# Patient Record
Sex: Male | Born: 2000 | Race: White | Hispanic: No | Marital: Single | Smoking: Never smoker
Health system: Southern US, Community
[De-identification: ages and names within clinical notes are randomized; demographics above are authoritative.]

## PROBLEM LIST (undated history)

## (undated) DIAGNOSIS — T7840XA Allergy, unspecified, initial encounter: Secondary | ICD-10-CM

## (undated) DIAGNOSIS — E119 Type 2 diabetes mellitus without complications: Secondary | ICD-10-CM

## (undated) DIAGNOSIS — K589 Irritable bowel syndrome without diarrhea: Secondary | ICD-10-CM

## (undated) HISTORY — DX: Irritable bowel syndrome without diarrhea: K58.9

## (undated) HISTORY — DX: Allergy, unspecified, initial encounter: T78.40XA

## (undated) HISTORY — DX: Type 2 diabetes mellitus without complications: E11.9

---
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
## 2019-01-09 ENCOUNTER — Other Ambulatory Visit (INDEPENDENT_AMBULATORY_CARE_PROVIDER_SITE_OTHER): Payer: Self-pay | Admitting: Family

## 2019-01-23 ENCOUNTER — Encounter (INDEPENDENT_AMBULATORY_CARE_PROVIDER_SITE_OTHER): Payer: Self-pay | Admitting: Family

## 2019-01-23 ENCOUNTER — Other Ambulatory Visit: Payer: Self-pay

## 2019-01-23 ENCOUNTER — Ambulatory Visit (INDEPENDENT_AMBULATORY_CARE_PROVIDER_SITE_OTHER): Payer: BC Managed Care – PPO | Admitting: Family

## 2019-01-23 VITALS — BP 106/58 | HR 72 | Ht 69.69 in | Wt 152.2 lb

## 2019-01-23 DIAGNOSIS — R739 Hyperglycemia, unspecified: Secondary | ICD-10-CM

## 2019-01-23 DIAGNOSIS — Z4681 Encounter for fitting and adjustment of insulin pump: Secondary | ICD-10-CM

## 2019-01-23 DIAGNOSIS — F54 Psychological and behavioral factors associated with disorders or diseases classified elsewhere: Secondary | ICD-10-CM

## 2019-01-23 DIAGNOSIS — R7309 Other abnormal glucose: Secondary | ICD-10-CM

## 2019-01-23 DIAGNOSIS — IMO0001 Reserved for inherently not codable concepts without codable children: Secondary | ICD-10-CM

## 2019-01-23 DIAGNOSIS — E1065 Type 1 diabetes mellitus with hyperglycemia: Secondary | ICD-10-CM

## 2019-01-23 DIAGNOSIS — E10649 Type 1 diabetes mellitus with hypoglycemia without coma: Secondary | ICD-10-CM | POA: Diagnosis not present

## 2019-01-23 LAB — POCT GLUCOSE (DEVICE FOR HOME USE): POC Glucose: 251 mg/dl — AB (ref 70–99)

## 2019-01-23 LAB — POCT GLYCOSYLATED HEMOGLOBIN (HGB A1C): Hemoglobin A1C: 8.7 % — AB (ref 4.0–5.6)

## 2019-01-23 NOTE — Progress Notes (Signed)
Pediatric Endocrinology Diabetes follow up Consultation Visit  James Yu November 15, 2000 [id number]  Chief Complaint:  Type 1 Diabetes    Yu, James RadonJoshua A, MD   HPI: James Yu  is a 18 y.o. male presenting for follow-up of Type 1 Diabetes   he is accompanied to this visit by his mother.  1. James Yu was diagnosed with type 1 diabetes at 18 years of age. [facility]. He was initially on MDI and the transitioned to [employer] insulin pump. During his time with diabetes he has switch back to MDI but struggled with consistency. He is currently using an OMnipod insulin pump and Freestyle libre. He does not feel like his diabetes care has been very good over the past 2-3 year.   2. Since his last visit to clinic on 09/2018, he has been well. No ER of hospital visits.   He took a month off from work when COVID was spiking but has started back at [employer]. Wearing Dexcom CGM and Tandem Tslim using Control IQ. He feels like he has had better blood sugars since starting control IQ. However, he does acknowledge that he will go days without bolusing some weeks. He also has decreased his carb intake so he is no bolusing to correct blood sugar when he is hyperglycemic. At night he does not bolus for snacks and then will run high all night.   He states that his mom made changes to his pump by increasing his basal rates because he was running high. He did not tell her that he was running higher because he was not consistently bolusing. He now has to decrease his bolus amount to prevent going low.    Insulin regimen: Omnipod Insulin Pump  Basal Rates 12AM 1.10  7am 1.75             Insulin to Carbohydrate Ratio 12AM 15  7am 6  4pm 7          Insulin Sensitivity Factor 12AM 50               Target Blood Glucose 12AM 140  7am 120            Hypoglycemia: cannot feel most low blood sugars.  No glucagon needed recently.  Insulin  pump and CGm download   - Avg Bg 204  - Target range; In target 22%, above target 74% and below target 4%   - pattern of hyperglycemia between 2pm-8am  - Using 56 units per day   - 70% basal!!! And 30% bolus.  Med-alert ID: is not currently wearing. Injection/Pump sites: arms, abdomen.  Annual labs due: 2020 Ophthalmology due: 2020.  Reminded to get annual dilated eye exam    3. ROS: Greater than 10 systems reviewed with pertinent positives listed in HPI, otherwise neg. Constitutional: Good energy and appetite. Sleeping well.  Eyes: No changes in vision Ears/Nose/Mouth/Throat: No difficulty swallowing. Cardiovascular: No palpitations Respiratory: No increased work of breathing Gastrointestinal: No constipation or diarrhea. No abdominal pain Genitourinary: No nocturia, no polyuria Musculoskeletal: No joint pain Neurologic: Normal sensation, no tremor Endocrine: No polydipsia.  No hyperpigmentation Psychiatric: Normal affect  Past Medical History:   Past Medical History:  Diagnosis Date  . Allergy   . Diabetes mellitus without complication (HCC) 08/2003  . Irritable bowel syndrome (IBS)     Medications:  Outpatient Encounter Medications as of 01/23/2019  Medication Sig Note  . Continuous Blood Gluc Sensor (DEXCOM G6 SENSOR)  MISC 1 Units by Does not apply route as directed. Change sensor every 10 days   . Continuous Blood Gluc Transmit (DEXCOM G6 TRANSMITTER) MISC 1 Units by Does not apply route every 3 (three) months. Change transmitter every 3 months   . fluticasone (FLONASE) 50 MCG/ACT nasal spray Place 1 spray into both nostrils daily as needed for allergies or rhinitis.   . Glucagon (BAQSIMI ONE PACK) 3 MG/DOSE POWD Place 1 Dose into the nose as needed.   Marland Kitchen. glucose blood (ONETOUCH VERIO) test strip Check blood sugar 6x daily   . insulin aspart (NOVOLOG) 100 UNIT/ML injection INJECT 300 UNITS INTO THE PUMP EVERY 48 HOURS   . Insulin Infusion Pump (T:SLIM X2 INS  PUMP/CONTROL-IQ) DEVI    . loratadine (CLARITIN) 10 MG tablet Take 10 mg by mouth daily as needed.    . loratadine-pseudoephedrine (CLARITIN-D 24 HOUR) 10-240 MG 24 hr tablet Claritin-D 24 Hour 10 mg-240 mg tablet,extended release   . metFORMIN (GLUCOPHAGE-XR) 750 MG 24 hr tablet Take 750 mg by mouth daily as needed.  08/14/2018: Patient still has if needed but has not had to take in several months  . acetaminophen (TYLENOL) 325 MG tablet Take 2 tablets (650 mg total) by mouth every 6 (six) hours as needed for mild pain. (Patient not taking: Reported on 10/23/2018)   . B Complex Vitamins (VITAMIN-B COMPLEX) TABS Take 1 capsule by mouth daily.   Marland Kitchen. BESIVANCE 0.6 % SUSP Place 1 drop into the left eye 3 (three) times daily.   . Continuous Blood Gluc Sensor (FREESTYLE LIBRE 14 DAY SENSOR) MISC Inject into the skin.   Marland Kitchen. dicyclomine (BENTYL) 10 MG capsule Take 1 capsule (10 mg total) by mouth 4 (four) times daily -  before meals and at bedtime. (Patient taking differently: Take 10 mg by mouth 4 (four) times daily as needed. ) 01/23/2019: PRN  . diphenhydrAMINE (BENADRYL) 25 mg capsule Take 1 capsule (25 mg total) by mouth every 8 (eight) hours as needed for itching. (Patient not taking: Reported on 10/23/2018)   . DM-Phenylephrine-Acetaminophen (VICKS DAYQUIL COLD & FLU) 10-5-325 MG CAPS Take 2 capsules by mouth daily as needed.   . docusate sodium (COLACE) 100 MG capsule Take 1 capsule (100 mg total) by mouth daily. (Patient not taking: Reported on 10/23/2018)   . esomeprazole (NEXIUM) 20 MG capsule Take 20 mg by mouth daily at 12 noon.   Marland Kitchen. HYDROcodone-acetaminophen (NORCO) 7.5-325 MG tablet Take 1 tablet by mouth every 6 (six) hours as needed for severe pain. (Patient not taking: Reported on 10/23/2018)   . magnesium oxide (MAG-OX) 400 MG tablet Take 400 mg by mouth daily as needed.  01/23/2019: PRN  . polyethylene glycol (MIRALAX / GLYCOLAX) packet Take 17 g by mouth daily. Take while on narcotic pain medication  to prevent constipation. (Patient not taking: Reported on 01/23/2019)   . trimethoprim-polymyxin b (POLYTRIM) ophthalmic solution Place 1 drop into the left eye 3 (three) times daily.    No facility-administered encounter medications on file as of 01/23/2019.     Allergies: Allergies  Allergen Reactions  . Augmentin [Amoxicillin-Pot Clavulanate] Hives  . Doxycycline Hives    Surgical History: No past surgical history on file.  Family History:  Family History  Problem Relation Age of Onset  . Irritable bowel syndrome Mother   . Hyperlipidemia Father   . Hypertension Father   . Asthma Brother   . ADD / ADHD Brother   . Heart disease Maternal Grandmother   .  Hyperlipidemia Maternal Grandmother   . Hypertension Maternal Grandmother   . Parkinson's disease Maternal Grandmother   . Heart attack Maternal Grandmother   . COPD Maternal Grandfather   . Hyperlipidemia Maternal Grandfather   . Hypertension Maternal Grandfather   . Anemia Maternal Grandfather   . Hyperlipidemia Paternal Grandmother   . Hypertension Paternal Grandmother   . Migraines Paternal Grandmother   . Hyperlipidemia Paternal Grandfather   . Hypertension Paternal Grandfather   . Atrial fibrillation Paternal Grandfather       Social History: Lives with: mother and father Levora Dredge at [facility]. Studying business as major.   Physical Exam:  Vitals:   01/23/19 0925  BP: (!) 106/58  Pulse: 72  Weight: 152 lb 3.2 oz (69 kg)  Height: 5' 9.69" (1.77 m)   BP (!) 106/58   Pulse 72   Ht 5' 9.69" (1.77 m)   Wt 152 lb 3.2 oz (69 kg)   BMI 22.04 kg/m  Body mass index: body mass index is 22.04 kg/m. Blood pressure percentiles are not available for patients who are 18 years or older.  Ht Readings from Last 3 Encounters:  01/23/19 5' 9.69" (1.77 m) (53 %, Z= 0.06)*  08/14/18 5\' 10"  (1.778 m) (58 %, Z= 0.20)*  07/24/18 5\' 9"  (1.753 m) (44 %, Z= -0.15)*   * Growth percentiles are based on CDC (Boys, 2-20 Years) data.    Wt Readings from Last 3 Encounters:  01/23/19 152 lb 3.2 oz (69 kg) (51 %, Z= 0.02)*  08/14/18 160 lb (72.6 kg) (65 %, Z= 0.39)*  07/24/18 158 lb 12.8 oz (72 kg) (64 %, Z= 0.36)*   * Growth percentiles are based on CDC (Boys, 2-20 Years) data.    General: Well developed, well nourished male in no acute distress.  Alert and oriented.  Head: Normocephalic, atraumatic.   Eyes:  Pupils equal and round. EOMI.  Sclera white.  No eye drainage.   Ears/Nose/Mouth/Throat: Nares patent, no nasal drainage.  Normal dentition, mucous membranes moist.  Neck: supple, no cervical lymphadenopathy, no thyromegaly Cardiovascular: regular rate, normal S1/S2, no murmurs Respiratory: No increased work of breathing.  Lungs clear to auscultation bilaterally.  No wheezes. Abdomen: soft, nontender, nondistended. Normal bowel sounds.  No appreciable masses  Extremities: warm, well perfused, cap refill < 2 sec.   Musculoskeletal: Normal muscle mass.  Normal strength Skin: warm, dry.  No rash or lesions. Neurologic: alert and oriented, normal speech, no tremor   Labs:  Lab Results  Component Value Date   HGBA1C 8.7 (A) 01/23/2019    Assessment/Plan: Masato is a 18 y.o. male with uncontrolled type 1 diabetes on Tslim insulin pump and Dexcom CGm. He has not been bolusing consistently which is causing frequent hyperglycemia. Relying heavily on basal insulin since family made adjustments at home. Hemoglobin A1c is 8.7% which is higher then ADA goal of <7%.   1. Uncontrolled type 1 diabetes mellitus with hyperglycemia (HCC)/ 2. Hyperglycemia/ 3. Elevated hemoglobin A1c  - Reviewed insulin pump and CGm download. Discussed trends and patterns.  - Discussed importance of bolusing for carbs and also for correction when blood sugar is high  - Discussed danger of relying heavily on basal insulin  - Bolus 15 minutes prior to eating to limit blood sugar spikes.  - Rotate injection sites to prevent scar tissue.   - POCT glucose and hemoglobin A1c   4. Hypoglycemia unawareness  - Reviewed signs and symptoms of hypoglycemia. Always have glucose  - Wear medical  alert ID  5. Maladaptive Behaviors.  - Discussed barriers to care  - Advised that he needs to enter carbs and blood sugar to pump for bolusing.  - Encouraged to upload pump to [employer] website and send me mychart message so that I can make titration between visits.  - Answered questions.   6. Insulin pump titration.  Basal Rates 12AM 1.10  7am 1.75--> 1.60              Insulin to Carbohydrate Ratio 12AM 15--> 12   7am 6  4pm 7         - Advised that he must bolus! Give dose pump recommends .   Follow-up:   3 months.   I have spent >40 minutes with >50% of time in counseling, education and instruction. When a patient is on insulin, intensive monitoring of blood glucose levels is necessary to avoid hyperglycemia and hypoglycemia. Severe hyperglycemia/hypoglycemia can lead to hospital admissions and be life threatening.    Gretchen ShortSpenser Kentavius Dettore,  FNP-C  Pediatric Specialist  [address]  [place], [id number]  Tele: [phone]

## 2019-01-23 NOTE — Patient Instructions (Addendum)
-   A1c has decreased to 8.7% Good work   - Your using 70% basal insulin which is TO MUCH! It is covering up for you not bolusing.    - Multiple days with no boluses the entire day which is causing long period of hyperglycemia   - I am decreasing daytime basal. You much bolus for your carbs!   - Do not change recommended boluses.   - In 1 week, upload pump to [employer] and send me a mychart message to review it.   - Let me make changes.

## 2019-02-09 ENCOUNTER — Encounter (INDEPENDENT_AMBULATORY_CARE_PROVIDER_SITE_OTHER): Payer: Self-pay
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
## 2019-02-26 ENCOUNTER — Other Ambulatory Visit (INDEPENDENT_AMBULATORY_CARE_PROVIDER_SITE_OTHER): Payer: Self-pay | Admitting: *Deleted

## 2019-02-26 ENCOUNTER — Encounter (INDEPENDENT_AMBULATORY_CARE_PROVIDER_SITE_OTHER): Payer: Self-pay

## 2019-03-22 ENCOUNTER — Other Ambulatory Visit (INDEPENDENT_AMBULATORY_CARE_PROVIDER_SITE_OTHER): Payer: Self-pay | Admitting: Family

## 2019-04-26 ENCOUNTER — Ambulatory Visit (INDEPENDENT_AMBULATORY_CARE_PROVIDER_SITE_OTHER): Payer: BC Managed Care – PPO | Admitting: Family

## 2019-05-01 ENCOUNTER — Encounter (INDEPENDENT_AMBULATORY_CARE_PROVIDER_SITE_OTHER): Payer: Self-pay | Admitting: Family

## 2019-05-01 ENCOUNTER — Other Ambulatory Visit: Payer: Self-pay

## 2019-05-01 ENCOUNTER — Ambulatory Visit (INDEPENDENT_AMBULATORY_CARE_PROVIDER_SITE_OTHER): Payer: BC Managed Care – PPO | Admitting: Family

## 2019-05-01 VITALS — BP 126/84 | HR 106 | Ht 69.92 in | Wt 156.4 lb

## 2019-05-01 DIAGNOSIS — R739 Hyperglycemia, unspecified: Secondary | ICD-10-CM

## 2019-05-01 DIAGNOSIS — E10649 Type 1 diabetes mellitus with hypoglycemia without coma: Secondary | ICD-10-CM | POA: Diagnosis not present

## 2019-05-01 DIAGNOSIS — Z23 Encounter for immunization: Secondary | ICD-10-CM | POA: Diagnosis not present

## 2019-05-01 DIAGNOSIS — F54 Psychological and behavioral factors associated with disorders or diseases classified elsewhere: Secondary | ICD-10-CM | POA: Diagnosis not present

## 2019-05-01 DIAGNOSIS — R7309 Other abnormal glucose: Secondary | ICD-10-CM

## 2019-05-01 DIAGNOSIS — E1065 Type 1 diabetes mellitus with hyperglycemia: Secondary | ICD-10-CM

## 2019-05-01 LAB — POCT GLYCOSYLATED HEMOGLOBIN (HGB A1C): Hemoglobin A1C: 8.4 % — AB (ref 4.0–5.6)

## 2019-05-01 LAB — POCT GLUCOSE (DEVICE FOR HOME USE): POC Glucose: 200 mg/dl — AB (ref 70–99)

## 2019-05-01 NOTE — Patient Instructions (Addendum)
-   You have to bolus for your food and when your blood sugar is high  - You have multiple days per week with 0 boluses!  - Hemoglobin A1c is 8.4%  - No changes to settings, bolusing correctly will improve blood sugars.   -Always have fast sugar with you in case of low blood sugar (glucose tabs, regular juice or soda, candy) -Always wear your ID that states you have diabetes -Always bring your meter to your visit -Call/Email if you want to review blood sugars

## 2019-05-01 NOTE — Progress Notes (Signed)
Pediatric Endocrinology Diabetes follow up Consultation Visit  James Yu 03/26/2001 [id number]  Chief Complaint:  Type 1 Diabetes    James Yu, James Kaufmann, MD   HPI: James Yu  is a 18 y.o. male presenting for follow-up of Type 1 Diabetes   he is accompanied to this visit by his mother.  1. James Yu was diagnosed with type 1 diabetes at 18 years of age. He had been followed by [facility] since that time. He was initially on MDI and the transitioned to [facility] insulin pump. During his time with diabetes he has switch back to MDI but struggled with consistency. He is currently using an OMnipod insulin pump and Freestyle libre. He does not feel like his diabetes care has been very good over the past 2-3 year.   2. Since his last visit to clinic on 12/2018, he has been well. No ER of hospital visits.   He has been busy with school and working at [employer]. He is mainly doing closing from 3pm to 11pm. School is online for him. He is using Tandem Tslim insulin pump and Dexcom CGM. Both are working well for him. He states that he is not bolusing very often and mainly relies on his pump. He estimates that he will bolus about once a day or less. He has noticed more variability in blood sugars lately but feels like they are pretty good when he does bolus.   He is rotating his insulin pump sites to more areas now. Denies any swelling or tenderness to pump site areas. Keeping glucose with him at all times.    Insulin regimen: Omnipod Insulin Pump  Basal Rates 12AM 1.20   7am 1.40  10am 1.60           Insulin to Carbohydrate Ratio 12AM 12  7am 6  4pm 6          Insulin Sensitivity Factor 12AM 50               Target Blood Glucose 12AM 140  7am 120            Hypoglycemia: cannot feel most low blood sugars.  No glucagon needed recently.  Insulin pump and CGm download   - Avg B 232  - Target range: in target 14%, above target 83% and below target 3%   -  High variability in blood sugars, mainly hyperglycemic. He is rarely bolusing, relying on pump to manually bolus.   - Using 63% basal and 37% bolus.  Med-alert ID: is not currently wearing. Injection/Pump sites: arms, abdomen.  Annual labs due: 2020 Ophthalmology due: 2021.  Reminded to get annual dilated eye exam    3. ROS: Greater than 10 systems reviewed with pertinent positives listed in HPI, otherwise neg. Constitutional: weight stable. Sleeping well.  Eyes: No changes in vision Ears/Nose/Mouth/Throat: No difficulty swallowing. Cardiovascular: No palpitations Respiratory: No increased work of breathing Gastrointestinal: No constipation or diarrhea. No abdominal pain Genitourinary: No nocturia, no polyuria Musculoskeletal: No joint pain Neurologic: Normal sensation, no tremor Endocrine: No polydipsia.  No hyperpigmentation Psychiatric: Normal affect  Past Medical History:   Past Medical History:  Diagnosis Date  . Allergy   . Diabetes mellitus without complication ([place]) 04/1750  . Irritable bowel syndrome (IBS)     Medications:  Outpatient Encounter Medications as of 05/01/2019  Medication Sig Note  . acetaminophen (TYLENOL) 325 MG tablet Take 2 tablets (650 mg total) by mouth every 6 (six) hours as needed for mild pain.   Marland Kitchen  BD PEN NEEDLE NANO U/F 32G X 4 MM MISC USE UP TO 6 TIMES DAILY   . BESIVANCE 0.6 % SUSP Place 1 drop into the left eye 3 (three) times daily.   . Continuous Blood Gluc Sensor (DEXCOM G6 SENSOR) MISC 1 Units by Does not apply route as directed. Change sensor every 10 days   . Continuous Blood Gluc Sensor (FREESTYLE LIBRE 14 DAY SENSOR) MISC Inject into the skin.   . Continuous Blood Gluc Transmit (DEXCOM G6 TRANSMITTER) MISC 1 Units by Does not apply route every 3 (three) months. Change transmitter every 3 months   . dicyclomine (BENTYL) 10 MG capsule Take 1 capsule (10 mg total) by mouth 4 (four) times daily -  before meals and at bedtime. (Patient taking  differently: Take 10 mg by mouth 4 (four) times daily as needed. ) 01/23/2019: PRN  . fluticasone (FLONASE) 50 MCG/ACT nasal spray Place 1 spray into both nostrils daily as needed for allergies or rhinitis.   . Glucagon (BAQSIMI ONE PACK) 3 MG/DOSE POWD Place 1 Dose into the nose as needed.   Marland Kitchen glucose blood (ONETOUCH VERIO) test strip Check blood sugar 6x daily   . insulin aspart (NOVOLOG) 100 UNIT/ML injection INJECT 300 UNITS INTO THE PUMP EVERY 48 HOURS   . Insulin Infusion Pump (T:SLIM X2 INS PUMP/CONTROL-IQ) DEVI    . loratadine (CLARITIN) 10 MG tablet Take 10 mg by mouth daily as needed.    . loratadine-pseudoephedrine (CLARITIN-D 24 HOUR) 10-240 MG 24 hr tablet Claritin-D 24 Hour 10 mg-240 mg tablet,extended release   . magnesium oxide (MAG-OX) 400 MG tablet Take 400 mg by mouth daily as needed.  01/23/2019: PRN  . metFORMIN (GLUCOPHAGE-XR) 750 MG 24 hr tablet Take 750 mg by mouth daily as needed.  08/14/2018: Patient still has if needed but has not had to take in several months  . polyethylene glycol (MIRALAX / GLYCOLAX) packet Take 17 g by mouth daily. Take while on narcotic pain medication to prevent constipation.   Marland Kitchen trimethoprim-polymyxin b (POLYTRIM) ophthalmic solution Place 1 drop into the left eye 3 (three) times daily.   . B Complex Vitamins (VITAMIN-B COMPLEX) TABS Take 1 capsule by mouth daily.   . diphenhydrAMINE (BENADRYL) 25 mg capsule Take 1 capsule (25 mg total) by mouth every 8 (eight) hours as needed for itching. (Patient not taking: Reported on 10/23/2018)   . DM-Phenylephrine-Acetaminophen (VICKS DAYQUIL COLD & FLU) 10-5-325 MG CAPS Take 2 capsules by mouth daily as needed.   . docusate sodium (COLACE) 100 MG capsule Take 1 capsule (100 mg total) by mouth daily. (Patient not taking: Reported on 10/23/2018)   . esomeprazole (NEXIUM) 20 MG capsule Take 20 mg by mouth daily at 12 noon.   Marland Kitchen HYDROcodone-acetaminophen (NORCO) 7.5-325 MG tablet Take 1 tablet by mouth every 6 (six)  hours as needed for severe pain. (Patient not taking: Reported on 10/23/2018)   . Insulin Disposable Pump (OMNIPOD DASH 5 PACK PODS) MISC CHANGE EVERY 72 HOURS    No facility-administered encounter medications on file as of 05/01/2019.     Allergies: Allergies  Allergen Reactions  . Augmentin [Amoxicillin-Pot Clavulanate] Hives  . Doxycycline Hives    Surgical History: No past surgical history on file.  Family History:  Family History  Problem Relation Age of Onset  . Irritable bowel syndrome Mother   . Hyperlipidemia Father   . Hypertension Father   . Asthma Brother   . ADD / ADHD Brother   . Heart  disease Maternal Grandmother   . Hyperlipidemia Maternal Grandmother   . Hypertension Maternal Grandmother   . Parkinson's disease Maternal Grandmother   . Heart attack Maternal Grandmother   . COPD Maternal Grandfather   . Hyperlipidemia Maternal Grandfather   . Hypertension Maternal Grandfather   . Anemia Maternal Grandfather   . Hyperlipidemia Paternal Grandmother   . Hypertension Paternal Grandmother   . Migraines Paternal Grandmother   . Hyperlipidemia Paternal Grandfather   . Hypertension Paternal Grandfather   . Atrial fibrillation Paternal Grandfather       Social History: Lives with: mother and father Amada Kingfisher at [facility]. Studying business as major.   Physical Exam:  Vitals:   05/01/19 1114  Weight: 156 lb 6.4 oz (70.9 kg)  Height: 5' 9.92" (1.776 m)   Ht 5' 9.92" (1.776 m)   Wt 156 lb 6.4 oz (70.9 kg)   BMI 22.49 kg/m  Body mass index: body mass index is 22.49 kg/m. Blood pressure percentiles are not available for patients who are 18 years or older.  Ht Readings from Last 3 Encounters:  05/01/19 5' 9.92" (1.776 m) (55 %, Z= 0.14)*  01/23/19 5' 9.69" (1.77 m) (53 %, Z= 0.06)*  08/14/18 5\' 10"  (1.778 m) (58 %, Z= 0.20)*   * Growth percentiles are based on CDC (Boys, 2-20 Years) data.   Wt Readings from Last 3 Encounters:  05/01/19 156 lb 6.4 oz (70.9  kg) (56 %, Z= 0.15)*  01/23/19 152 lb 3.2 oz (69 kg) (51 %, Z= 0.02)*  08/14/18 160 lb (72.6 kg) (65 %, Z= 0.39)*   * Growth percentiles are based on CDC (Boys, 2-20 Years) data.    General: Well developed, well nourished male in no acute distress.  Alert and oriented.  Head: Normocephalic, atraumatic.   Eyes:  Pupils equal and round. EOMI.  Sclera white.  No eye drainage.   Ears/Nose/Mouth/Throat: Nares patent, no nasal drainage.  Normal dentition, mucous membranes moist.  Neck: supple, no cervical lymphadenopathy, no thyromegaly Cardiovascular: regular rate, normal S1/S2, no murmurs Respiratory: No increased work of breathing.  Lungs clear to auscultation bilaterally.  No wheezes. Abdomen: soft, nontender, nondistended. Normal bowel sounds.  No appreciable masses  Extremities: warm, well perfused, cap refill < 2 sec.   Musculoskeletal: Normal muscle mass.  Normal strength Skin: warm, dry.  No rash or lesions. Neurologic: alert and oriented, normal speech, no tremor  Labs:  Lab Results  Component Value Date   HGBA1C 8.7 (A) 01/23/2019    Assessment/Plan: Ioan is a 18 y.o. male with uncontrolled type 1 diabetes on Tslim insulin pump and Dexcom CGm. He is rarely bolusing for carb intake or blood sugars, relies on insulin pump to manually bolus. His hemoglobin A1c is 8.4% which is higher then ADA goal of <7%. Overall control would be greatly improved by consistently bolusing.   1. Uncontrolled type 1 diabetes mellitus with hyperglycemia (HCC)/ 2. Hyperglycemia/ 3. Elevated hemoglobin A1c  - Reviewed insulin pump and CGM download. Discussed trends and patterns.  - Rotate pump sites to prevent scar tissue.  - bolus 15 minutes prior to eating to limit blood sugar spikes.  - Reviewed carb counting and importance of accurate carb counting.  - Discussed signs and symptoms of hypoglycemia. Always have glucose available.  - POCT glucose and hemoglobin A1c  - Reviewed growth chart.   - Discussed importance of bolusing for carb intake and to correct blood sugars.  - Advised that Tslim insulin pump gives a 60% correction  and is not intended to replace bolusing for carbs.   4. Hypoglycemia unawareness  - Wear CGM and monitor glucose levels closely  - Discussed signs and symptoms.  - Keep glucose available at all times.   5. Maladaptive Behaviors.  - Discussed barriers to care and concerns.  - Answered questions.   6. Insulin pump titration.   NO changes. Stressed importance of bolusing for carb intake.   Influenza vaccine given. Counseling provided.   Follow-up:   3 months.   I have spent 40 minutes with >50% of time in counseling, education and instruction. When a patient is on insulin, intensive monitoring of blood glucose levels is necessary to avoid hyperglycemia and hypoglycemia. Severe hyperglycemia/hypoglycemia can lead to hospital admissions and be life threatening.     Gretchen ShortSpenser Jahkai Yandell,  FNP-C  Pediatric Specialist  [address]  [place], [id number]  Tele: [phone]

## 2019-05-20 ENCOUNTER — Other Ambulatory Visit (INDEPENDENT_AMBULATORY_CARE_PROVIDER_SITE_OTHER): Payer: Self-pay | Admitting: Family

## 2019-05-30 ENCOUNTER — Other Ambulatory Visit: Payer: Self-pay

## 2019-05-30 DIAGNOSIS — Z20822 Contact with and (suspected) exposure to covid-19: Secondary | ICD-10-CM

## 2019-05-31 LAB — NOVEL CORONAVIRUS, NAA: SARS-CoV-2, NAA: NOT DETECTED

## 2019-06-03 ENCOUNTER — Ambulatory Visit (INDEPENDENT_AMBULATORY_CARE_PROVIDER_SITE_OTHER): Payer: BC Managed Care – PPO | Admitting: Family Medicine

## 2019-06-03 ENCOUNTER — Encounter: Payer: Self-pay | Admitting: Family Medicine

## 2019-06-03 DIAGNOSIS — H66012 Acute suppurative otitis media with spontaneous rupture of ear drum, left ear: Secondary | ICD-10-CM | POA: Diagnosis not present

## 2019-06-03 MED ORDER — CEFUROXIME AXETIL 250 MG PO TABS: 250.0000 mg | ORAL_TABLET | Freq: Two times a day (BID) | ORAL | 0 refills | Status: AC

## 2019-06-03 NOTE — Progress Notes (Signed)
    Subjective:    Patient ID: James Yu, male    DOB: [birth year], 18 y.o.   MRN: [id number]   HPI: James Yu is a 18 y.o. male presenting for Symptoms include left earache, HA. Also congestion, facial pain, nasal congestion, non productive cough, post nasal drip and sinus pressure. There is 100 degree fever yesterday. 99 degrees today.. Onset of symptoms was 5 days ago, gradually worsening since that time. Covid test neg 4 days ago.Mom is a Marine scientist. Looked in ear sees exudate and bubbles. sys there is a tiny cyst in the canal that is bleeding.    Depression screen [facility] 2/9 12/01/2017 05/04/2017 03/10/2017  Decreased Interest 0 0 2  Down, Depressed, Hopeless 0 0 0  PHQ - 2 Score 0 0 2  Altered sleeping - - 2  Tired, decreased energy - - 2  Change in appetite - - 0  Feeling bad or failure about yourself  - - 0  Trouble concentrating - - 1  Moving slowly or fidgety/restless - - 0  Suicidal thoughts - - 0  PHQ-9 Score - - 7     Relevant past medical, surgical, family and social history reviewed and updated as indicated.  Interim medical history since our last visit reviewed. Allergies and medications reviewed and updated.  ROS:  Review of Systems  Noncontributory other than as in HPI Social History   Tobacco Use  Smoking Status Never Smoker  Smokeless Tobacco Never Used       Objective:     Wt Readings from Last 3 Encounters:  05/01/19 156 lb 6.4 oz (70.9 kg) (56 %, Z= 0.15)*  01/23/19 152 lb 3.2 oz (69 kg) (51 %, Z= 0.02)*  08/14/18 160 lb (72.6 kg) (65 %, Z= 0.39)*   * Growth percentiles are based on CDC (Boys, 2-20 Years) data.     Exam deferred. Pt. Harboring due to COVID 19. Phone visit performed.   Assessment & Plan:  No diagnosis found.  Meds ordered this encounter  Medications  . cefUROXime (CEFTIN) 250 MG tablet    Sig: Take 1 tablet (250 mg total) by mouth 2 (two) times daily with a meal for 10 days.    Dispense:  20 tablet    Refill:  0    No orders of the defined types were placed in this encounter.     There are no diagnoses linked to this encounter.  Virtual Visit via telephone Note  I discussed the limitations, risks, security and privacy concerns of performing an evaluation and management service by telephone and the availability of in person appointments. The patient was identified with two identifiers. Pt.expressed understanding and agreed to proceed. Pt. Is at home. Dr. Livia Snellen is in his office.  Follow Up Instructions:   I discussed the assessment and treatment plan with the patient. The patient was provided an opportunity to ask questions and all were answered. The patient agreed with the plan and demonstrated an understanding of the instructions.   The patient was advised to call back or seek an in-person evaluation if the symptoms worsen or if the condition fails to improve as anticipated.   Total minutes including chart review and phone contact time: 11   Follow up plan: No follow-ups on file.  Claretta Fraise, MD [place]

## 2019-07-12 ENCOUNTER — Encounter (INDEPENDENT_AMBULATORY_CARE_PROVIDER_SITE_OTHER): Payer: Self-pay | Admitting: Family

## 2019-08-01 ENCOUNTER — Ambulatory Visit (INDEPENDENT_AMBULATORY_CARE_PROVIDER_SITE_OTHER): Payer: BC Managed Care – PPO | Admitting: Family

## 2019-09-03 ENCOUNTER — Encounter (INDEPENDENT_AMBULATORY_CARE_PROVIDER_SITE_OTHER): Payer: Self-pay | Admitting: Family

## 2019-09-03 ENCOUNTER — Other Ambulatory Visit (INDEPENDENT_AMBULATORY_CARE_PROVIDER_SITE_OTHER): Payer: Self-pay

## 2019-09-03 ENCOUNTER — Other Ambulatory Visit: Payer: Self-pay

## 2019-09-03 ENCOUNTER — Ambulatory Visit (INDEPENDENT_AMBULATORY_CARE_PROVIDER_SITE_OTHER): Payer: BC Managed Care – PPO | Admitting: Family

## 2019-09-03 VITALS — BP 116/76 | HR 72 | Ht 69.76 in | Wt 159.4 lb

## 2019-09-03 DIAGNOSIS — R739 Hyperglycemia, unspecified: Secondary | ICD-10-CM

## 2019-09-03 DIAGNOSIS — E10649 Type 1 diabetes mellitus with hypoglycemia without coma: Secondary | ICD-10-CM | POA: Diagnosis not present

## 2019-09-03 DIAGNOSIS — Z9641 Presence of insulin pump (external) (internal): Secondary | ICD-10-CM

## 2019-09-03 DIAGNOSIS — R7309 Other abnormal glucose: Secondary | ICD-10-CM

## 2019-09-03 DIAGNOSIS — F54 Psychological and behavioral factors associated with disorders or diseases classified elsewhere: Secondary | ICD-10-CM

## 2019-09-03 DIAGNOSIS — E1065 Type 1 diabetes mellitus with hyperglycemia: Secondary | ICD-10-CM

## 2019-09-03 LAB — POCT GLYCOSYLATED HEMOGLOBIN (HGB A1C): Hemoglobin A1C: 8.4 % — AB (ref 4.0–5.6)

## 2019-09-03 LAB — POCT GLUCOSE (DEVICE FOR HOME USE): Glucose Fasting, POC: 145 mg/dL — AB (ref 70–99)

## 2019-09-03 MED ORDER — BAQSIMI ONE PACK 3 MG/DOSE NA POWD: 1.0000 | NASAL | 1 refills | Status: DC | PRN

## 2019-09-03 NOTE — Patient Instructions (Addendum)
-  Always have fast sugar with you in case of low blood sugar (glucose tabs, regular juice or soda, candy) -Always wear your ID that states you have diabetes -Always bring your meter to your visit -Call/Email if you want to review blood sugars  1. With control IQ, at night blood sugars will stay lower but should be mor stable.   - Unless under 70, do not treat, wait 15 minutes to see if blood sugar starts to come back up.   - If you do need to treat try 5-10 grams instead of 15-20 .   2. Do not start eating for low blood during the day until you are actually low.   3. Make sure you bolus for all carb intake.   4. If you notice a pattern of low blood sugar or high blood sugars, upload your pump to tconnects and send me a message to look at it.   Hemoglobin A1c is 8.4%.

## 2019-09-03 NOTE — Progress Notes (Signed)
Pediatric Endocrinology Diabetes follow up Consultation Visit  WESAM GEARHART [birth year] [id number]  Chief Complaint:  Type 1 Diabetes    Dettinger, Elige Radon, MD   HPI: James Yu  is a 19 y.o. male presenting for follow-up of Type 1 Diabetes   he is accompanied to this visit by his mother.  1. [name] was diagnosed with type 1 diabetes at 19 years of age. He had been followed by [facility] Endocrinology since that time. He was initially on MDI and the transitioned to [facility] insulin pump. During his time with diabetes he has switch back to MDI but struggled with consistency. He is currently using an OMnipod insulin pump and Freestyle libre. He does not feel like his diabetes care has been very good over the past 2-3 year.   2. Since his last visit to clinic on 04/2019, he has been well. No ER of hospital visits.   He is excited about going to [place] with his friends soon, has been busy with work otherwise. He is using Tslim insulin pump with Control IQ. He reports that he has noticed his blood sugars trending down at night so he will drink a juice when his blood sugar gets near 100. His blood sugar then rebounds and stays high. He is trying to bolus more consistently when he eats. He is rotating pump sites every 3 days and using he butt and abdomen.   Insulin regimen: Omnipod Insulin Pump  Basal Rates 12AM 1.20   7am 1.40  10am 1.60           Insulin to Carbohydrate Ratio 12AM 12  7am 6  4pm 6          Insulin Sensitivity Factor 12AM 50               Target Blood Glucose 12AM 140  7am 120            Hypoglycemia: cannot feel most low blood sugars.  No glucagon needed recently.  Insulin pump and CGm download   Med-alert ID: is not currently wearing. Injection/Pump sites: arms, abdomen.  Annual labs due: 2020 Ophthalmology due: 2021.  Reminded to get annual dilated eye exam    3. ROS: Greater than 10 systems reviewed with pertinent positives listed in HPI,  otherwise neg. Constitutional: Sleeping well. WEight is stable.  Eyes: No changes in vision Ears/Nose/Mouth/Throat: No difficulty swallowing. Cardiovascular: No palpitations Respiratory: No increased work of breathing Gastrointestinal: No constipation or diarrhea. No abdominal pain Genitourinary: No nocturia, no polyuria Musculoskeletal: No joint pain Neurologic: Normal sensation, no tremor Endocrine: No polydipsia.  No hyperpigmentation Psychiatric: Normal affect  Past Medical History:   Past Medical History:  Diagnosis Date  . Allergy   . Diabetes mellitus without complication (HCC) 08/2003  . Irritable bowel syndrome (IBS)     Medications:  Outpatient Encounter Medications as of 09/03/2019  Medication Sig Note  . acetaminophen (TYLENOL) 325 MG tablet Take 2 tablets (650 mg total) by mouth every 6 (six) hours as needed for mild pain.   . B Complex Vitamins (VITAMIN-B COMPLEX) TABS Take 1 capsule by mouth daily.   . BD PEN NEEDLE NANO U/F 32G X 4 MM MISC USE UP TO 6 TIMES DAILY   . Continuous Blood Gluc Sensor (DEXCOM G6 SENSOR) MISC 1 Units by Does not apply route as directed. Change sensor every 10 days   . Continuous Blood Gluc Transmit (DEXCOM G6 TRANSMITTER) MISC 1 Units by Does not apply route every 3 (three) months.  Change transmitter every 3 months   . dicyclomine (BENTYL) 10 MG capsule Take 1 capsule (10 mg total) by mouth 4 (four) times daily -  before meals and at bedtime. (Patient taking differently: Take 10 mg by mouth 4 (four) times daily as needed. ) 01/23/2019: PRN  . esomeprazole (NEXIUM) 20 MG capsule Take 20 mg by mouth daily at 12 noon.   . fluticasone (FLONASE) 50 MCG/ACT nasal spray Place 1 spray into both nostrils daily as needed for allergies or rhinitis.   Marland Kitchen glucose blood (ONETOUCH VERIO) test strip Check blood sugar 6x daily   . insulin aspart (NOVOLOG) 100 UNIT/ML injection INJECT 300 UNITS INTO THE PUMP EVERY 48 HOURS   . loratadine (CLARITIN) 10 MG tablet  Take 10 mg by mouth daily as needed.    . magnesium oxide (MAG-OX) 400 MG tablet Take 400 mg by mouth daily as needed.  01/23/2019: PRN  . polyethylene glycol (MIRALAX / GLYCOLAX) packet Take 17 g by mouth daily. Take while on narcotic pain medication to prevent constipation.   Marland Kitchen trimethoprim-polymyxin b (POLYTRIM) ophthalmic solution Place 1 drop into the left eye 3 (three) times daily.   . [DISCONTINUED] Glucagon (BAQSIMI ONE PACK) 3 MG/DOSE POWD Place 1 Dose into the nose as needed.   Marland Kitchen BESIVANCE 0.6 % SUSP Place 1 drop into the left eye 3 (three) times daily.   . Continuous Blood Gluc Sensor (FREESTYLE LIBRE 14 DAY SENSOR) MISC Inject into the skin.   Marland Kitchen diphenhydrAMINE (BENADRYL) 25 mg capsule Take 1 capsule (25 mg total) by mouth every 8 (eight) hours as needed for itching. (Patient not taking: Reported on 10/23/2018)   . DM-Phenylephrine-Acetaminophen (VICKS DAYQUIL COLD & FLU) 10-5-325 MG CAPS Take 2 capsules by mouth daily as needed.   . docusate sodium (COLACE) 100 MG capsule Take 1 capsule (100 mg total) by mouth daily. (Patient not taking: Reported on 10/23/2018)   . HYDROcodone-acetaminophen (NORCO) 7.5-325 MG tablet Take 1 tablet by mouth every 6 (six) hours as needed for severe pain. (Patient not taking: Reported on 10/23/2018)   . Insulin Disposable Pump (OMNIPOD DASH 5 PACK PODS) MISC CHANGE EVERY 72 HOURS   . Insulin Infusion Pump (T:SLIM X2 INS PUMP/CONTROL-IQ) DEVI    . loratadine-pseudoephedrine (CLARITIN-D 24 HOUR) 10-240 MG 24 hr tablet Claritin-D 24 Hour 10 mg-240 mg tablet,extended release   . metFORMIN (GLUCOPHAGE-XR) 750 MG 24 hr tablet Take 750 mg by mouth daily as needed.  08/14/2018: Patient still has if needed but has not had to take in several months   No facility-administered encounter medications on file as of 09/03/2019.    Allergies: Allergies  Allergen Reactions  . Augmentin [Amoxicillin-Pot Clavulanate] Hives  . Doxycycline Hives    Surgical History: No past  surgical history on file.  Family History:  Family History  Problem Relation Age of Onset  . Irritable bowel syndrome Mother   . Hyperlipidemia Father   . Hypertension Father   . Asthma Brother   . ADD / ADHD Brother   . Heart disease Maternal Grandmother   . Hyperlipidemia Maternal Grandmother   . Hypertension Maternal Grandmother   . Parkinson's disease Maternal Grandmother   . Heart attack Maternal Grandmother   . COPD Maternal Grandfather   . Hyperlipidemia Maternal Grandfather   . Hypertension Maternal Grandfather   . Anemia Maternal Grandfather   . Hyperlipidemia Paternal Grandmother   . Hypertension Paternal Grandmother   . Migraines Paternal Grandmother   . Hyperlipidemia Paternal Grandfather   .  Hypertension Paternal Grandfather   . Atrial fibrillation Paternal Grandfather       Social History: Lives with: mother and father Levora Dredge at [facility]. Studying business as major.   Physical Exam:  Vitals:   09/03/19 1129  BP: 116/76  Pulse: 72  Weight: 159 lb 6.4 oz (72.3 kg)  Height: 5' 9.76" (1.772 m)   BP 116/76   Pulse 72   Ht 5' 9.76" (1.772 m)   Wt 159 lb 6.4 oz (72.3 kg)   BMI 23.03 kg/m  Body mass index: body mass index is 23.03 kg/m. Blood pressure percentiles are not available for patients who are 18 years or older.  Ht Readings from Last 3 Encounters:  09/03/19 5' 9.76" (1.772 m) (53 %, Z= 0.07)*  05/01/19 5' 9.92" (1.776 m) (55 %, Z= 0.14)*  01/23/19 5' 9.69" (1.77 m) (53 %, Z= 0.06)*   * Growth percentiles are based on CDC (Boys, 2-20 Years) data.   Wt Readings from Last 3 Encounters:  09/03/19 159 lb 6.4 oz (72.3 kg) (59 %, Z= 0.22)*  05/01/19 156 lb 6.4 oz (70.9 kg) (56 %, Z= 0.15)*  01/23/19 152 lb 3.2 oz (69 kg) (51 %, Z= 0.02)*   * Growth percentiles are based on CDC (Boys, 2-20 Years) data.    General: Well developed, well nourished male in no acute distress.  Alert and oriented.  Head: Normocephalic, atraumatic.   Eyes:  Pupils equal  and round. EOMI.  Sclera white.  No eye drainage.   Ears/Nose/Mouth/Throat: Nares patent, no nasal drainage.  Normal dentition, mucous membranes moist.  Neck: supple, no cervical lymphadenopathy, no thyromegaly Cardiovascular: regular rate, normal S1/S2, no murmurs Respiratory: No increased work of breathing.  Lungs clear to auscultation bilaterally.  No wheezes. Abdomen: soft, nontender, nondistended. Normal bowel sounds.  No appreciable masses  Extremities: warm, well perfused, cap refill < 2 sec.   Musculoskeletal: Normal muscle mass.  Normal strength Skin: warm, dry.  No rash or lesions. Neurologic: alert and oriented, normal speech, no tremor   Labs:  Lab Results  Component Value Date   HGBA1C 8.4 (A) 09/03/2019    Assessment/Plan: Meyer is a 19 y.o. male with uncontrolled type 1 diabetes on Tslim insulin pump and Dexcom CGm. He has been over treating hypoglycemia and/or taking glucose prior to being hypoglycemic which is causing more hyperglycemia. His hemoglobin A1c is 8.4% which is higher then ADA goal of <7%.    1. Uncontrolled type 1 diabetes mellitus with hyperglycemia (HCC)/ 2. Hyperglycemia/ 3. Elevated hemoglobin A1c  - Reviewed insulin pump and CGM download. Discussed trends and patterns.  - Rotate pump sites to prevent scar tissue.  - bolus 15 minutes prior to eating to limit blood sugar spikes.  - Reviewed carb counting and importance of accurate carb counting.  - Discussed signs and symptoms of hypoglycemia. Always have glucose available.  - POCT glucose and hemoglobin A1c  - Reviewed growth chart.  - Gave note for travel.   4. Hypoglycemia unawareness  - Wear CGM and monitor glucose levels closely  - Discussed signs and symptoms.  - Keep glucose available at all times.   5. Maladaptive Behaviors.  - Discussed concerns and barriers to care  - Answered questions.  -   6. Insulin pump titration.   NO changes.    Follow-up:   3 months.   >45   spent today reviewing the medical chart, counseling the patient/family, and documenting today's visit.  When a patient is on insulin,  intensive monitoring of blood glucose levels is necessary to avoid hyperglycemia and hypoglycemia. Severe hyperglycemia/hypoglycemia can lead to hospital admissions and be life threatening.     Gretchen Short,  FNP-C  Pediatric Specialist  [address]  [place], [address]  Tele: [phone]

## 2019-09-25 ENCOUNTER — Encounter (INDEPENDENT_AMBULATORY_CARE_PROVIDER_SITE_OTHER): Payer: Self-pay

## 2019-12-04 ENCOUNTER — Ambulatory Visit (INDEPENDENT_AMBULATORY_CARE_PROVIDER_SITE_OTHER): Payer: BC Managed Care – PPO | Admitting: Family

## 2019-12-06 ENCOUNTER — Ambulatory Visit: Payer: BC Managed Care – PPO | Attending: Internal Medicine

## 2019-12-06 DIAGNOSIS — Z23 Encounter for immunization: Secondary | ICD-10-CM

## 2019-12-06 NOTE — Progress Notes (Signed)
   Covid-19 Vaccination Clinic  Name:  ABDULKARIM EBERLIN    MRN: [id number] DOB: [birth year]  12/06/2019  Mr. Kestenbaum was observed post Covid-19 immunization for 15 minutes without incident. He was provided with Vaccine Information Sheet and instruction to access the V-Safe system.   Mr. Luger was instructed to call 911 with any severe reactions post vaccine: Marland Kitchen Difficulty breathing  . Swelling of face and throat  . A fast heartbeat  . A bad rash all over body  . Dizziness and weakness   Immunizations Administered    Name Date Dose VIS Date Route   Pfizer COVID-19 Vaccine 12/06/2019 12:27 PM 0.3 mL 09/18/2018 Intramuscular   Manufacturer: [employer], [employer]   Lot: [id number]   NDC: [address]-9      Covid-19 Vaccination Clinic  Name:  James Yu    MRN: [id number] DOB: May 02, 2001  12/06/2019  Mr. Seymour was observed post Covid-19 immunization for 15 minutes without incident. He was provided with Vaccine Information Sheet and instruction to access the V-Safe system.   Mr. Mclaren was instructed to call 911 with any severe reactions post vaccine: Marland Kitchen Difficulty breathing  . Swelling of face and throat  . A fast heartbeat  . A bad rash all over body  . Dizziness and weakness   Immunizations Administered    Name Date Dose VIS Date Route   Pfizer COVID-19 Vaccine 12/06/2019 12:27 PM 0.3 mL 09/18/2018 Intramuscular   Manufacturer: [employer], [employer]   Lot: [id number]   NDC: [address]-7

## 2019-12-10 ENCOUNTER — Encounter (INDEPENDENT_AMBULATORY_CARE_PROVIDER_SITE_OTHER): Payer: Self-pay | Admitting: Family

## 2019-12-10 ENCOUNTER — Ambulatory Visit (INDEPENDENT_AMBULATORY_CARE_PROVIDER_SITE_OTHER): Payer: BC Managed Care – PPO | Admitting: Family

## 2019-12-10 ENCOUNTER — Other Ambulatory Visit: Payer: Self-pay

## 2019-12-10 VITALS — BP 128/84 | HR 98 | Wt 159.4 lb

## 2019-12-10 DIAGNOSIS — E10649 Type 1 diabetes mellitus with hypoglycemia without coma: Secondary | ICD-10-CM

## 2019-12-10 DIAGNOSIS — F54 Psychological and behavioral factors associated with disorders or diseases classified elsewhere: Secondary | ICD-10-CM

## 2019-12-10 DIAGNOSIS — Z4681 Encounter for fitting and adjustment of insulin pump: Secondary | ICD-10-CM

## 2019-12-10 DIAGNOSIS — R739 Hyperglycemia, unspecified: Secondary | ICD-10-CM

## 2019-12-10 DIAGNOSIS — R7309 Other abnormal glucose: Secondary | ICD-10-CM

## 2019-12-10 DIAGNOSIS — E1065 Type 1 diabetes mellitus with hyperglycemia: Secondary | ICD-10-CM

## 2019-12-10 DIAGNOSIS — K589 Irritable bowel syndrome without diarrhea: Secondary | ICD-10-CM

## 2019-12-10 LAB — POCT GLUCOSE (DEVICE FOR HOME USE): POC Glucose: 288 mg/dl — AB (ref 70–99)

## 2019-12-10 LAB — POCT GLYCOSYLATED HEMOGLOBIN (HGB A1C): Hemoglobin A1C: 8.1 % — AB (ref 4.0–5.6)

## 2019-12-10 NOTE — Progress Notes (Signed)
Pediatric Endocrinology Diabetes follow up Consultation Visit  James Yu [birth year] [id number]  Chief Complaint:  Type 1 Diabetes    Dettinger, Elige Radon, MD   HPI: James Yu  is a 19 y.o. male presenting for follow-up of Type 1 Diabetes   he is accompanied to this visit by his mother.  1. James Yu was diagnosed with type 1 diabetes at 19 years of age. He had been followed by [facility] Endocrinology since that time. He was initially on MDI and the transitioned to [facility] insulin pump. During his time with diabetes he has switch back to MDI but struggled with consistency. He is currently using an OMnipod insulin pump and Freestyle libre. He does not feel like his diabetes care has been very good over the past 2-3 year.   2. Since his last visit to clinic on 08/2019, he has been well. No ER of hospital visits.   He is currently in school and then working at his [employer]. He is using TSlim insulin pump and Dexcom CGM. He thinks his blood sugar is running a little bit higher overall. Trying to improve bolusing but has not been eating as often. Changing pump sites about every 3 days.   He feels like his metabolism has been different. He is eating less right now and feels like he gets full very fast. He does have IBS which he knows is contributing. This has been ongoing for 1.5 months. He reports abdominal pain about every 3 days last for about 1 hour, usually when eating. Describes the pain as generalized and will stop when he has a bowel movement. Denies constipation and diarrhea. No nausea or vomiting. He has seen GI in he past.   Insulin regimen: Omnipod Insulin Pump  Basal Rates 12AM 1.20   7am 1.40  10am 1.60           Insulin to Carbohydrate Ratio 12AM 12  7am 6  4pm 6          Insulin Sensitivity Factor 12AM 50               Target Blood Glucose 12AM 140  7am 120            Hypoglycemia: cannot feel most low blood sugars.  No glucagon needed  recently.  Insulin pump CGm download   Med-alert ID: is not currently wearing. Injection/Pump sites: arms, abdomen.  Annual labs due: 2020 Ophthalmology due: 2021.  Reminded to get annual dilated eye exam    3. ROS: Greater than 10 systems reviewed with pertinent positives listed in HPI, otherwise neg. Constitutional: Sleeping well. WEight is stable.  Eyes: No changes in vision Ears/Nose/Mouth/Throat: No difficulty swallowing. Cardiovascular: No palpitations Respiratory: No increased work of breathing Gastrointestinal: No constipation or diarrhea. No abdominal pain Genitourinary: No nocturia, no polyuria Musculoskeletal: No joint pain Neurologic: Normal sensation, no tremor Endocrine: No polydipsia.  No hyperpigmentation Psychiatric: Normal affect  Past Medical History:   Past Medical History:  Diagnosis Date  . Allergy   . Diabetes mellitus without complication (HCC) 08/2003  . Irritable bowel syndrome (IBS)     Medications:  Outpatient Encounter Medications as of 12/10/2019  Medication Sig Note  . Continuous Blood Gluc Sensor (DEXCOM G6 SENSOR) MISC 1 Units by Does not apply route as directed. Change sensor every 10 days   . Continuous Blood Gluc Transmit (DEXCOM G6 TRANSMITTER) MISC 1 Units by Does not apply route every 3 (three) months. Change transmitter every 3 months   .  insulin aspart (NOVOLOG) 100 UNIT/ML injection INJECT 300 UNITS INTO THE PUMP EVERY 48 HOURS   . Insulin Disposable Pump (OMNIPOD DASH 5 PACK PODS) MISC CHANGE EVERY 72 HOURS   . Insulin Infusion Pump (T:SLIM X2 INS PUMP/CONTROL-IQ) DEVI    . acetaminophen (TYLENOL) 325 MG tablet Take 2 tablets (650 mg total) by mouth every 6 (six) hours as needed for mild pain. (Patient not taking: Reported on 12/10/2019)   . B Complex Vitamins (VITAMIN-B COMPLEX) TABS Take 1 capsule by mouth daily.   . BD PEN NEEDLE NANO U/F 32G X 4 MM MISC USE UP TO 6 TIMES DAILY (Patient not taking: Reported on 12/10/2019)   .  BESIVANCE 0.6 % SUSP Place 1 drop into the left eye 3 (three) times daily.   . Continuous Blood Gluc Sensor (FREESTYLE LIBRE 14 DAY SENSOR) MISC Inject into the skin.   Marland Kitchen dicyclomine (BENTYL) 10 MG capsule Take 1 capsule (10 mg total) by mouth 4 (four) times daily -  before meals and at bedtime. (Patient not taking: Reported on 12/10/2019) 01/23/2019: PRN  . diphenhydrAMINE (BENADRYL) 25 mg capsule Take 1 capsule (25 mg total) by mouth every 8 (eight) hours as needed for itching. (Patient not taking: Reported on 10/23/2018)   . DM-Phenylephrine-Acetaminophen (VICKS DAYQUIL COLD & FLU) 10-5-325 MG CAPS Take 2 capsules by mouth daily as needed.   . docusate sodium (COLACE) 100 MG capsule Take 1 capsule (100 mg total) by mouth daily. (Patient not taking: Reported on 10/23/2018)   . esomeprazole (NEXIUM) 20 MG capsule Take 20 mg by mouth daily at 12 noon.   . fluticasone (FLONASE) 50 MCG/ACT nasal spray Place 1 spray into both nostrils daily as needed for allergies or rhinitis.   . Glucagon (BAQSIMI ONE PACK) 3 MG/DOSE POWD Place 1 Dose into the nose as needed. (Patient not taking: Reported on 12/10/2019)   . glucose blood (ONETOUCH VERIO) test strip Check blood sugar 6x daily (Patient not taking: Reported on 12/10/2019)   . HYDROcodone-acetaminophen (NORCO) 7.5-325 MG tablet Take 1 tablet by mouth every 6 (six) hours as needed for severe pain. (Patient not taking: Reported on 10/23/2018)   . loratadine (CLARITIN) 10 MG tablet Take 10 mg by mouth daily as needed.    . loratadine-pseudoephedrine (CLARITIN-D 24 HOUR) 10-240 MG 24 hr tablet Claritin-D 24 Hour 10 mg-240 mg tablet,extended release   . magnesium oxide (MAG-OX) 400 MG tablet Take 400 mg by mouth daily as needed.  01/23/2019: PRN  . metFORMIN (GLUCOPHAGE-XR) 750 MG 24 hr tablet Take 750 mg by mouth daily as needed.  08/14/2018: Patient still has if needed but has not had to take in several months  . polyethylene glycol (MIRALAX / GLYCOLAX) packet Take 17 g  by mouth daily. Take while on narcotic pain medication to prevent constipation. (Patient not taking: Reported on 12/10/2019)   . trimethoprim-polymyxin b (POLYTRIM) ophthalmic solution Place 1 drop into the left eye 3 (three) times daily.    No facility-administered encounter medications on file as of 12/10/2019.    Allergies: Allergies  Allergen Reactions  . Augmentin [Amoxicillin-Pot Clavulanate] Hives  . Doxycycline Hives    Surgical History: No past surgical history on file.  Family History:  Family History  Problem Relation Age of Onset  . Irritable bowel syndrome Mother   . Hyperlipidemia Father   . Hypertension Father   . Asthma Brother   . ADD / ADHD Brother   . Heart disease Maternal Grandmother   . Hyperlipidemia  Maternal Grandmother   . Hypertension Maternal Grandmother   . Parkinson's disease Maternal Grandmother   . Heart attack Maternal Grandmother   . COPD Maternal Grandfather   . Hyperlipidemia Maternal Grandfather   . Hypertension Maternal Grandfather   . Anemia Maternal Grandfather   . Hyperlipidemia Paternal Grandmother   . Hypertension Paternal Grandmother   . Migraines Paternal Grandmother   . Hyperlipidemia Paternal Grandfather   . Hypertension Paternal Grandfather   . Atrial fibrillation Paternal Grandfather       Social History: Lives with: mother and father Levora Dredge at [facility]. Studying business as major.   Physical Exam:  Vitals:   12/10/19 1114  BP: 128/84  Pulse: 98  Weight: 159 lb 6.4 oz (72.3 kg)   BP 128/84   Pulse 98   Wt 159 lb 6.4 oz (72.3 kg)   BMI 23.03 kg/m  Body mass index: body mass index is 23.03 kg/m. Blood pressure percentiles are not available for patients who are 18 years or older.  Ht Readings from Last 3 Encounters:  09/03/19 5' 9.76" (1.772 m) (53 %, Z= 0.07)*  05/01/19 5' 9.92" (1.776 m) (55 %, Z= 0.14)*  01/23/19 5' 9.69" (1.77 m) (53 %, Z= 0.06)*   * Growth percentiles are based on CDC (Boys, 2-20 Years)  data.   Wt Readings from Last 3 Encounters:  12/10/19 159 lb 6.4 oz (72.3 kg) (57 %, Z= 0.18)*  09/03/19 159 lb 6.4 oz (72.3 kg) (59 %, Z= 0.22)*  05/01/19 156 lb 6.4 oz (70.9 kg) (56 %, Z= 0.15)*   * Growth percentiles are based on CDC (Boys, 2-20 Years) data.   General: Well developed, well nourished male in no acute distress.   Head: Normocephalic, atraumatic.   Eyes:  Pupils equal and round. EOMI.  Sclera white.  No eye drainage.   Ears/Nose/Mouth/Throat: Nares patent, no nasal drainage.  Normal dentition, mucous membranes moist.  Neck: supple, no cervical lymphadenopathy, no thyromegaly Cardiovascular: regular rate, normal S1/S2, no murmurs Respiratory: No increased work of breathing.  Lungs clear to auscultation bilaterally.  No wheezes. Abdomen: soft, nontender, nondistended. Normal bowel sounds.  No appreciable masses  Extremities: warm, well perfused, cap refill < 2 sec.   Musculoskeletal: Normal muscle mass.  Normal strength Skin: warm, dry.  No rash or lesions. Neurologic: alert and oriented, normal speech, no tremor    Labs:  Lab Results  Component Value Date   HGBA1C 8.4 (A) 09/03/2019    Assessment/Plan: Vraj is a 19 y.o. male with uncontrolled type 1 diabetes on Tslim insulin pump and Dexcom CGm. He has been over treating hypoglycemia and/or taking glucose prior to being hypoglycemic which is causing more hyperglycemia. His hemoglobin A1c is 8.4% which is higher then ADA goal of <7%.    1. Uncontrolled type 1 diabetes mellitus with hyperglycemia (HCC)/ 2. Hyperglycemia/ 3. Elevated hemoglobin A1c  - Reviewed insulin pump and CGM download. Discussed trends and patterns.  - Rotate pump sites to prevent scar tissue.  - bolus 15 minutes prior to eating to limit blood sugar spikes.  - Reviewed carb counting and importance of accurate carb counting.  - Discussed signs and symptoms of hypoglycemia. Always have glucose available.  - POCT glucose and hemoglobin  A1c  - Reviewed growth chart.  - Discussed importance of consistently bolusing before eating. Discussed insulin action time.  - Lipid panel, CMP, TFTS and microalbumin .   4. Hypoglycemia unawareness  - Wear CGM and monitor glucose levels closely  -  Discussed signs and symptoms.  - Keep glucose available at all times.   5. Maladaptive Behaviors.  - Discussed concerns and barriers to care  - Discussed balancing diabetes care with school and work.  - Answered questions.  -   6. Insulin pump titration.  Basal Rates 12AM 1.20   7am 1.40--> 1.47  10am 1.60 --> 1.67        36.9 u/day   Insulin to Carbohydrate Ratio 12AM 12  7am 6--> 5  4pm 6--> 5          Insulin Sensitivity Factor 12AM 50--> 45         7. IBS - Discussed importance of good diabetes control  - Encouraged to follow up with GI.   Follow-up:   3 months.   >50 spent today reviewing the medical chart, counseling the patient/family, and documenting today's visit.   When a patient is on insulin, intensive monitoring of blood glucose levels is necessary to avoid hyperglycemia and hypoglycemia. Severe hyperglycemia/hypoglycemia can lead to hospital admissions and be life threatening.     Gretchen Short,  FNP-C  Pediatric Specialist  [address]  [place], [address]  Tele: [phone]

## 2019-12-10 NOTE — Patient Instructions (Addendum)
Basal Rates 12AM 1.20   7am 1.40--> 1.47  10am 1.60 --> 1.67          Insulin to Carbohydrate Ratio 12AM 12  7am 6--> 5  4pm 6--> 5          Insulin Sensitivity Factor 12AM 50--> 43         - YOU NEED TO BOLUS!!!   - When you eat or before you eat. Not hours after   - Give correction boluses when your pump alarms that you are high.   - If you start having hypoglycemia, please call and I will make changes to settings.   - A1c is 8.1%   - I recommend you contact GI doctor about abdominal pain.

## 2019-12-11 LAB — COMPREHENSIVE METABOLIC PANEL
AG Ratio: 1.9 (calc) (ref 1.0–2.5)
ALT: 16 U/L (ref 8–46)
AST: 17 U/L (ref 12–32)
Albumin: 4.3 g/dL (ref 3.6–5.1)
Alkaline phosphatase (APISO): 115 U/L (ref 46–169)
BUN: 11 mg/dL (ref 7–20)
CO2: 31 mmol/L (ref 20–32)
Calcium: 9.9 mg/dL (ref 8.9–10.4)
Chloride: 100 mmol/L (ref 98–110)
Creat: 0.95 mg/dL (ref 0.60–1.26)
Globulin: 2.3 g/dL (calc) (ref 2.1–3.5)
Glucose, Bld: 275 mg/dL — ABNORMAL HIGH (ref 65–139)
Potassium: 4.7 mmol/L (ref 3.8–5.1)
Sodium: 139 mmol/L (ref 135–146)
Total Bilirubin: 1 mg/dL (ref 0.2–1.1)
Total Protein: 6.6 g/dL (ref 6.3–8.2)

## 2019-12-11 LAB — LIPID PANEL
Cholesterol: 123 mg/dL (ref <170)
HDL: 42 mg/dL — ABNORMAL LOW (ref >45)
LDL Cholesterol (Calc): 62 mg/dL (calc) (ref <110)
Non-HDL Cholesterol (Calc): 81 mg/dL (calc) (ref <120)
Total CHOL/HDL Ratio: 2.9 (calc) (ref <5.0)
Triglycerides: 105 mg/dL — ABNORMAL HIGH (ref <90)

## 2019-12-11 LAB — MICROALBUMIN / CREATININE URINE RATIO
Creatinine, Urine: 74 mg/dL (ref 20–320)
Microalb Creat Ratio: 4 mcg/mg creat (ref <30)
Microalb, Ur: 0.3 mg/dL

## 2019-12-11 LAB — TSH: TSH: 0.75 mIU/L (ref 0.50–4.30)

## 2019-12-11 LAB — T4, FREE: Free T4: 1.1 ng/dL (ref 0.8–1.4)

## 2019-12-27 ENCOUNTER — Ambulatory Visit: Payer: BC Managed Care – PPO | Attending: Internal Medicine

## 2019-12-27 DIAGNOSIS — Z23 Encounter for immunization: Secondary | ICD-10-CM

## 2019-12-27 NOTE — Progress Notes (Signed)
   Covid-19 Vaccination Clinic  Name:  James CASELLI    MRN: [id number] DOB: Dec 01, 2000  12/27/2019  Mr. Lemmons was observed post Covid-19 immunization for 15 minutes without incident. He was provided with Vaccine Information Sheet and instruction to access the V-Safe system.   Mr. Vangieson was instructed to call 911 with any severe reactions post vaccine: Marland Kitchen Difficulty breathing  . Swelling of face and throat  . A fast heartbeat  . A bad rash all over body  . Dizziness and weakness   Immunizations Administered    Name Date Dose VIS Date Route   Pfizer COVID-19 Vaccine 12/27/2019 11:20 AM 0.3 mL 09/18/2018 Intramuscular   Manufacturer: [employer], [employer]   Lot: [id number]   NDC: [address]-9

## 2019-12-27 NOTE — Progress Notes (Signed)
   Covid-19 Vaccination Clinic  Name:  James Yu    MRN: [id number] DOB: 07/23/2001  12/27/2019  Mr. Caudell was observed post Covid-19 immunization for 15 minutes without incident. He was provided with Vaccine Information Sheet and instruction to access the V-Safe system.   Mr. Brining was instructed to call 911 with any severe reactions post vaccine: . Difficulty breathing  . Swelling of face and throat  . A fast heartbeat  . A bad rash all over body  . Dizziness and weakness   Immunizations Administered    Name Date Dose VIS Date Route   Pfizer COVID-19 Vaccine 12/27/2019 11:20 AM 0.3 mL 09/18/2018 Intramuscular   Manufacturer: Pfizer, Inc   Lot: [id number]   NDC: [address]-2     

## 2020-01-28 ENCOUNTER — Encounter (INDEPENDENT_AMBULATORY_CARE_PROVIDER_SITE_OTHER): Payer: Self-pay

## 2020-01-28 DIAGNOSIS — E1065 Type 1 diabetes mellitus with hyperglycemia: Secondary | ICD-10-CM

## 2020-01-28 MED ORDER — DEXCOM G6 TRANSMITTER MISC: 1.0000 [IU] | 1 refills | Status: DC

## 2020-01-28 MED ORDER — DEXCOM G6 SENSOR MISC: 1.0000 [IU] | 1 refills | Status: DC

## 2020-01-30 ENCOUNTER — Ambulatory Visit (INDEPENDENT_AMBULATORY_CARE_PROVIDER_SITE_OTHER): Payer: BC Managed Care – PPO | Admitting: Family Medicine

## 2020-01-30 ENCOUNTER — Encounter: Payer: Self-pay | Admitting: Family Medicine

## 2020-01-30 DIAGNOSIS — Z202 Contact with and (suspected) exposure to infections with a predominantly sexual mode of transmission: Secondary | ICD-10-CM | POA: Diagnosis not present

## 2020-01-30 LAB — HM DIABETES EYE EXAM

## 2020-01-30 MED ORDER — AZITHROMYCIN 500 MG PO TABS: 1000.0000 mg | ORAL_TABLET | Freq: Once | ORAL | 0 refills | Status: AC

## 2020-01-30 NOTE — Progress Notes (Signed)
Virtual Visit via Telephone Note  I connected with James Yu on 01/30/20 at 4:56 PM by telephone and verified that I am speaking with the correct person using two identifiers. James Yu is currently located at home and nobody is currently with him during this visit. The provider, Gwenlyn Fudge, FNP is located in their office at time of visit.  I discussed the limitations, risks, security and privacy concerns of performing an evaluation and management service by telephone and the availability of in person appointments. I also discussed with the patient that there may be a patient responsible charge related to this service. The patient expressed understanding and agreed to proceed.  Subjective: PCP: Dettinger, Elige Radon, MD  Chief Complaint  Patient presents with  . Exposure to STD   Patient reports he does not know his girlfriend tested positive for chlamydia.  They have been sexually active.  He denies any symptoms.   ROS: Per HPI  Current Outpatient Medications:  .  acetaminophen (TYLENOL) 325 MG tablet, Take 2 tablets (650 mg total) by mouth every 6 (six) hours as needed for mild pain. (Patient not taking: Reported on 12/10/2019), Disp: , Rfl:  .  B Complex Vitamins (VITAMIN-B COMPLEX) TABS, Take 1 capsule by mouth daily., Disp: , Rfl:  .  BD PEN NEEDLE NANO U/F 32G X 4 MM MISC, USE UP TO 6 TIMES DAILY (Patient not taking: Reported on 12/10/2019), Disp: 200 each, Rfl: 5 .  BESIVANCE 0.6 % SUSP, Place 1 drop into the left eye 3 (three) times daily., Disp: , Rfl:  .  Continuous Blood Gluc Sensor (DEXCOM G6 SENSOR) MISC, 1 Units by Does not apply route as directed. Change sensor every 10 days, Disp: 9 each, Rfl: 1 .  Continuous Blood Gluc Sensor (FREESTYLE LIBRE 14 DAY SENSOR) MISC, Inject into the skin., Disp: , Rfl:  .  Continuous Blood Gluc Transmit (DEXCOM G6 TRANSMITTER) MISC, 1 Units by Does not apply route every 3 (three) months. Change transmitter every 3 months,  Disp: 1 each, Rfl: 1 .  dicyclomine (BENTYL) 10 MG capsule, Take 1 capsule (10 mg total) by mouth 4 (four) times daily -  before meals and at bedtime. (Patient not taking: Reported on 12/10/2019), Disp: 120 capsule, Rfl: 2 .  diphenhydrAMINE (BENADRYL) 25 mg capsule, Take 1 capsule (25 mg total) by mouth every 8 (eight) hours as needed for itching. (Patient not taking: Reported on 10/23/2018), Disp: 30 capsule, Rfl: 0 .  DM-Phenylephrine-Acetaminophen (VICKS DAYQUIL COLD & FLU) 10-5-325 MG CAPS, Take 2 capsules by mouth daily as needed., Disp: , Rfl:  .  docusate sodium (COLACE) 100 MG capsule, Take 1 capsule (100 mg total) by mouth daily. (Patient not taking: Reported on 10/23/2018), Disp: 10 capsule, Rfl: 0 .  esomeprazole (NEXIUM) 20 MG capsule, Take 20 mg by mouth daily at 12 noon., Disp: , Rfl:  .  fluticasone (FLONASE) 50 MCG/ACT nasal spray, Place 1 spray into both nostrils daily as needed for allergies or rhinitis., Disp: , Rfl:  .  Glucagon (BAQSIMI ONE PACK) 3 MG/DOSE POWD, Place 1 Dose into the nose as needed. (Patient not taking: Reported on 12/10/2019), Disp: 2 each, Rfl: 1 .  glucose blood (ONETOUCH VERIO) test strip, Check blood sugar 6x daily (Patient not taking: Reported on 12/10/2019), Disp: 600 each, Rfl: 1 .  HYDROcodone-acetaminophen (NORCO) 7.5-325 MG tablet, Take 1 tablet by mouth every 6 (six) hours as needed for severe pain. (Patient not taking: Reported on 10/23/2018),  Disp: 20 tablet, Rfl: 0 .  insulin aspart (NOVOLOG) 100 UNIT/ML injection, INJECT 300 UNITS INTO THE PUMP EVERY 48 HOURS, Disp: 120 mL, Rfl: 1 .  Insulin Disposable Pump (OMNIPOD DASH 5 PACK PODS) MISC, CHANGE EVERY 72 HOURS, Disp: , Rfl:  .  Insulin Infusion Pump (T:SLIM X2 INS PUMP/CONTROL-IQ) DEVI, , Disp: , Rfl:  .  loratadine (CLARITIN) 10 MG tablet, Take 10 mg by mouth daily as needed. , Disp: , Rfl:  .  loratadine-pseudoephedrine (CLARITIN-D 24 HOUR) 10-240 MG 24 hr tablet, Claritin-D 24 Hour 10 mg-240 mg  tablet,extended release, Disp: , Rfl:  .  magnesium oxide (MAG-OX) 400 MG tablet, Take 400 mg by mouth daily as needed. , Disp: , Rfl:  .  metFORMIN (GLUCOPHAGE-XR) 750 MG 24 hr tablet, Take 750 mg by mouth daily as needed. , Disp: , Rfl:  .  polyethylene glycol (MIRALAX / GLYCOLAX) packet, Take 17 g by mouth daily. Take while on narcotic pain medication to prevent constipation. (Patient not taking: Reported on 12/10/2019), Disp: 14 each, Rfl: 0 .  trimethoprim-polymyxin b (POLYTRIM) ophthalmic solution, Place 1 drop into the left eye 3 (three) times daily., Disp: , Rfl:   Allergies  Allergen Reactions  . Augmentin [Amoxicillin-Pot Clavulanate] Hives  . Doxycycline Hives   Past Medical History:  Diagnosis Date  . Allergy   . Diabetes mellitus without complication (HCC) 08/2003  . Irritable bowel syndrome (IBS)     Observations/Objective: A&O  No respiratory distress or wheezing audible over the phone Mood, judgement, and thought processes all WNL   Assessment and Plan: 1. Exposure to chlamydia - Advised not to have sex until both he and his girlfriend are treated and are negative for other STDs as well. - azithromycin (ZITHROMAX) 500 MG tablet; Take 2 tablets (1,000 mg total) by mouth once for 1 dose.  Dispense: 2 tablet; Refill: 0  2. Exposure to sexually transmitted disease (STD) - STD Screen (8); Future - Urine cytology ancillary only; Future   Follow Up Instructions:  I discussed the assessment and treatment plan with the patient. The patient was provided an opportunity to ask questions and all were answered. The patient agreed with the plan and demonstrated an understanding of the instructions.   The patient was advised to call back or seek an in-person evaluation if the symptoms worsen or if the condition fails to improve as anticipated.  The above assessment and management plan was discussed with the patient. The patient verbalized understanding of and has agreed to the  management plan. Patient is aware to call the clinic if symptoms persist or worsen. Patient is aware when to return to the clinic for a follow-up visit. Patient educated on when it is appropriate to go to the emergency department.   Time call ended: 5:05 PM  I provided 11 minutes of non-face-to-face time during this encounter.  Deliah Boston, MSN, APRN, FNP-C [facility] 01/30/20

## 2020-01-31 ENCOUNTER — Other Ambulatory Visit (HOSPITAL_COMMUNITY)
Admission: RE | Admit: 2020-01-31 | Discharge: 2020-01-31 | Disposition: A | Discharge status: Home / Self Care | Payer: BC Managed Care – PPO | Source: Ambulatory Visit | Attending: Family Medicine | Admitting: Family Medicine

## 2020-01-31 ENCOUNTER — Other Ambulatory Visit: Payer: Self-pay

## 2020-01-31 ENCOUNTER — Other Ambulatory Visit: Payer: BC Managed Care – PPO

## 2020-01-31 DIAGNOSIS — Z202 Contact with and (suspected) exposure to infections with a predominantly sexual mode of transmission: Secondary | ICD-10-CM

## 2020-01-31 NOTE — Addendum Note (Signed)
Addended by: Lorelee Cover C on: 01/31/2020 11:35 AM   Modules accepted: Orders

## 2020-02-01 LAB — STD SCREEN (8)
HIV Screen 4th Generation wRfx: NONREACTIVE
HSV 1 Glycoprotein G Ab, IgG: <0.91 index (ref 0.00–0.90)
HSV 2 IgG, Type Spec: <0.91 index (ref 0.00–0.90)
Hep A IgM: NEGATIVE
Hep B C IgM: NEGATIVE
Hep C Virus Ab: <0.1 s/co ratio (ref 0.0–0.9)
Hepatitis B Surface Ag: NEGATIVE
RPR Ser Ql: NONREACTIVE

## 2020-02-03 LAB — URINE CYTOLOGY ANCILLARY ONLY
Chlamydia: POSITIVE — AB
Comment: NEGATIVE
Comment: NEGATIVE
Comment: NORMAL
Neisseria Gonorrhea: NEGATIVE
Trichomonas: NEGATIVE

## 2020-02-04 ENCOUNTER — Telehealth: Payer: Self-pay | Admitting: Family Medicine

## 2020-02-04 NOTE — Telephone Encounter (Signed)
Did you want pt to wait 2 weeks after treatment or come in now for test of cure? Pt was just treated 01/30/20.

## 2020-02-04 NOTE — Telephone Encounter (Signed)
Two weeks for test of cure.

## 2020-02-04 NOTE — Telephone Encounter (Signed)
Pt's mother aware that urine to be checked 2 weeks after completion of antibiotics and future order is placed.

## 2020-02-21 ENCOUNTER — Other Ambulatory Visit: Payer: Self-pay

## 2020-02-21 ENCOUNTER — Encounter: Payer: Self-pay | Admitting: Family

## 2020-02-21 ENCOUNTER — Other Ambulatory Visit (HOSPITAL_COMMUNITY)
Admission: RE | Admit: 2020-02-21 | Discharge: 2020-02-21 | Disposition: A | Discharge status: Home / Self Care | Payer: BC Managed Care – PPO | Source: Ambulatory Visit | Attending: Family Medicine | Admitting: Family Medicine

## 2020-02-21 ENCOUNTER — Ambulatory Visit: Payer: BC Managed Care – PPO | Admitting: Family

## 2020-02-21 DIAGNOSIS — H65191 Other acute nonsuppurative otitis media, right ear: Secondary | ICD-10-CM

## 2020-02-21 DIAGNOSIS — Z202 Contact with and (suspected) exposure to infections with a predominantly sexual mode of transmission: Secondary | ICD-10-CM

## 2020-02-21 MED ORDER — CEFDINIR 300 MG PO CAPS: 300.0000 mg | ORAL_CAPSULE | Freq: Two times a day (BID) | ORAL | 0 refills | Status: DC

## 2020-02-21 NOTE — Patient Instructions (Signed)

## 2020-02-21 NOTE — Addendum Note (Signed)
Addended by: Caryl Bis on: 02/21/2020 12:27 PM   Modules accepted: Orders

## 2020-02-21 NOTE — Progress Notes (Signed)
Subjective:    Patient ID: James Yu, male    DOB: Mar 19, 2001, 19 y.o.   MRN: [id number]  Chief Complaint  Patient presents with   Otalgia   Pt presents to the office today with right ear pain that started last night. He reports he had flown on 02/12/20 and noticed a little "ear popping". She reports his girlfriend and child had RSV 02/11/20 and he started having similar 02/12/20 with cough, congestion. He states these have resolved.  Otalgia  There is pain in the right ear. This is a new problem. The current episode started yesterday. The problem occurs constantly. The problem has been waxing and waning. There has been no fever. Associated symptoms include coughing (dry ). Pertinent negatives include no ear discharge, headaches, hearing loss, rhinorrhea or sore throat. He has tried NSAIDs for the symptoms. The treatment provided mild relief.      Review of Systems  HENT: Positive for ear pain. Negative for ear discharge, hearing loss, rhinorrhea and sore throat.   Respiratory: Positive for cough (dry ).   Neurological: Negative for headaches.  All other systems reviewed and are negative.      Objective:   Physical Exam Vitals reviewed.  Constitutional:      General: He is not in acute distress.    Appearance: He is well-developed.  HENT:     Head: Normocephalic.     Right Ear: Tenderness present. A middle ear effusion is present. Tympanic membrane is erythematous.     Left Ear: A middle ear effusion is present.  Eyes:     General:        Right eye: No discharge.        Left eye: No discharge.     Pupils: Pupils are equal, round, and reactive to light.  Neck:     Thyroid: No thyromegaly.  Cardiovascular:     Rate and Rhythm: Normal rate and regular rhythm.     Heart sounds: Normal heart sounds. No murmur heard.   Pulmonary:     Effort: Pulmonary effort is normal. No respiratory distress.     Breath sounds: Normal breath sounds. No wheezing.  Abdominal:      General: Bowel sounds are normal. There is no distension.     Palpations: Abdomen is soft.     Tenderness: There is no abdominal tenderness.  Musculoskeletal:        General: No tenderness. Normal range of motion.     Cervical back: Normal range of motion and neck supple.  Skin:    General: Skin is warm and dry.     Findings: No erythema or rash.  Neurological:     Mental Status: He is alert and oriented to person, place, and time.     Cranial Nerves: No cranial nerve deficit.     Deep Tendon Reflexes: Reflexes are normal and symmetric.  Psychiatric:        Behavior: Behavior normal.        Thought Content: Thought content normal.        Judgment: Judgment normal.       BP (!) 129/82    Pulse 79    Temp 98 F (36.7 C) (Temporal)    Ht 5' 9.8" (1.773 m)    Wt 155 lb 6.4 oz (70.5 kg)    BMI 22.43 kg/m      Assessment & Plan:  1. Other non-recurrent acute nonsuppurative otitis media of right ear Do not stick anything into ear  Keep clean and dry Tylenol as needed  Discussed possible allergic reaction, if any s/s of hives, SOB, or swelling stop medication - cefdinir (OMNICEF) 300 MG capsule; Take 1 capsule (300 mg total) by mouth 2 (two) times daily. 1 po BID  Dispense: 20 capsule; Refill: 0   Jannifer Rodney, FNP

## 2020-02-24 ENCOUNTER — Ambulatory Visit: Payer: BC Managed Care – PPO

## 2020-02-24 LAB — URINE CYTOLOGY ANCILLARY ONLY
Chlamydia: NEGATIVE
Comment: NEGATIVE
Comment: NEGATIVE
Comment: NORMAL
Neisseria Gonorrhea: NEGATIVE
Trichomonas: NEGATIVE

## 2020-03-10 ENCOUNTER — Encounter: Payer: BC Managed Care – PPO | Admitting: Family Medicine

## 2020-03-11 ENCOUNTER — Ambulatory Visit (INDEPENDENT_AMBULATORY_CARE_PROVIDER_SITE_OTHER): Payer: BC Managed Care – PPO | Admitting: Family

## 2020-04-14 ENCOUNTER — Ambulatory Visit (INDEPENDENT_AMBULATORY_CARE_PROVIDER_SITE_OTHER): Payer: BC Managed Care – PPO | Admitting: Family

## 2020-04-23 ENCOUNTER — Ambulatory Visit (INDEPENDENT_AMBULATORY_CARE_PROVIDER_SITE_OTHER): Payer: BC Managed Care – PPO | Admitting: Family

## 2020-04-23 ENCOUNTER — Other Ambulatory Visit: Payer: Self-pay

## 2020-04-23 ENCOUNTER — Encounter (INDEPENDENT_AMBULATORY_CARE_PROVIDER_SITE_OTHER): Payer: Self-pay | Admitting: Family

## 2020-04-23 VITALS — BP 116/80 | HR 74 | Wt 159.4 lb

## 2020-04-23 DIAGNOSIS — E1065 Type 1 diabetes mellitus with hyperglycemia: Secondary | ICD-10-CM

## 2020-04-23 DIAGNOSIS — R739 Hyperglycemia, unspecified: Secondary | ICD-10-CM | POA: Diagnosis not present

## 2020-04-23 DIAGNOSIS — Z9641 Presence of insulin pump (external) (internal): Secondary | ICD-10-CM

## 2020-04-23 DIAGNOSIS — E10649 Type 1 diabetes mellitus with hypoglycemia without coma: Secondary | ICD-10-CM | POA: Diagnosis not present

## 2020-04-23 DIAGNOSIS — F54 Psychological and behavioral factors associated with disorders or diseases classified elsewhere: Secondary | ICD-10-CM

## 2020-04-23 DIAGNOSIS — R7309 Other abnormal glucose: Secondary | ICD-10-CM

## 2020-04-23 LAB — POCT GLUCOSE (DEVICE FOR HOME USE): POC Glucose: 242 mg/dl — AB (ref 70–99)

## 2020-04-23 LAB — POCT GLYCOSYLATED HEMOGLOBIN (HGB A1C): Hemoglobin A1C: 8.5 % — AB (ref 4.0–5.6)

## 2020-04-23 NOTE — Progress Notes (Signed)
Pediatric Endocrinology Diabetes follow up Consultation Visit  Myrtie SomanChristian T Dismuke [birth year] [id number]  Chief Complaint:  Type 1 Diabetes    Dettinger, Elige RadonJoshua A, MD   HPI: Ephriam KnucklesChristian  is a 19 y.o. male presenting for follow-up of Type 1 Diabetes   he is accompanied to this visit by his mother.  1. [name] was diagnosed with type 1 diabetes at 19 years of age. [facility] that [facility]. He was initially on MDI and the transitioned to [facility] insulin pump. During his time with diabetes he has switch back to MDI but struggled with consistency. He is currently using an OMnipod insulin pump and Freestyle libre. He does not feel like his diabetes care has been very good over the past 2-3 year.   2. Since his last visit to clinic on 11/2019, he has been well. No ER of hospital visits.   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   He is wearing Tslim insulin pump and Dexcom CGM. Reports that they are working "pretty good" except for occasional problems with sites. He finds that he will get a red bump and occasionally has discharge (looks like pus). He reports that he is cleaning with alcohol swabs prior to applying site. Feels like this is why he occasionally has elevated blood sugars.   He acknowledges that he does not bolus "as often as he is suppose to". He frequently snacks and forgets to bolus. He tries to bolus before eating when he eats meals. He will also do override bolus instead of entering blood sugars and carbs.      Insulin regimen: Omnipod Insulin Pump  Basal Rates 12AM 1.20   7am 1.47  10am 1.67        36.9 u/day   Insulin to Carbohydrate Ratio 12AM 12  7am  5  4pm 5          Insulin Sensitivity Factor 12AM  45         Target Blood Glucose 12AM 140  7am 120            Hypoglycemia: cannot feel most low blood  sugars.  No glucagon needed recently.  Insulin pump CGm download   Med-alert ID: is not currently wearing. Injection/Pump sites: arms, abdomen.  Annual labs due: 11/2020 Ophthalmology due: 2021.  Reminded to get annual dilated eye exam    3. ROS: Greater than 10 systems reviewed with pertinent positives listed in HPI, otherwise neg. Constitutional: Sleeping well. WEight is stable.  Eyes: No changes in vision Ears/Nose/Mouth/Throat: No difficulty swallowing. Cardiovascular: No palpitations Respiratory: No increased work of breathing Gastrointestinal: No constipation or diarrhea. No abdominal pain Genitourinary: No nocturia, no polyuria Musculoskeletal: No joint pain Neurologic: Normal sensation, no tremor Endocrine: No polydipsia.  No hyperpigmentation Psychiatric: Normal affect  Past Medical History:   Past Medical History:  Diagnosis Date  . Allergy   . Diabetes mellitus without complication (HCC) 08/2003  . Irritable bowel syndrome (IBS)     Medications:  Outpatient Encounter Medications as of 04/23/2020  Medication Sig Note  . insulin aspart (NOVOLOG) 100 UNIT/ML injection INJECT 300 UNITS INTO THE PUMP EVERY 48 HOURS   . acetaminophen (TYLENOL) 325 MG tablet Take 2 tablets (650 mg total) by mouth every 6 (six) hours as needed for mild pain. (Patient not taking: Reported on 04/23/2020)   .  B Complex Vitamins (VITAMIN-B COMPLEX) TABS Take 1 capsule by mouth daily. (Patient not taking: Reported on 04/23/2020)   . BD PEN NEEDLE NANO U/F 32G X 4 MM MISC USE UP TO 6 TIMES DAILY (Patient not taking: Reported on 04/23/2020)   . BESIVANCE 0.6 % SUSP Place 1 drop into the left eye 3 (three) times daily. (Patient not taking: Reported on 04/23/2020)   . cefdinir (OMNICEF) 300 MG capsule Take 1 capsule (300 mg total) by mouth 2 (two) times daily. 1 po BID (Patient not taking: Reported on 04/23/2020)   . Continuous Blood Gluc Sensor (DEXCOM G6 SENSOR) MISC 1 Units by Does not apply route as  directed. Change sensor every 10 days (Patient not taking: Reported on 04/23/2020)   . Continuous Blood Gluc Sensor (FREESTYLE LIBRE 14 DAY SENSOR) MISC Inject into the skin. (Patient not taking: Reported on 04/23/2020)   . Continuous Blood Gluc Transmit (DEXCOM G6 TRANSMITTER) MISC 1 Units by Does not apply route every 3 (three) months. Change transmitter every 3 months (Patient not taking: Reported on 04/23/2020)   . dicyclomine (BENTYL) 10 MG capsule Take 1 capsule (10 mg total) by mouth 4 (four) times daily -  before meals and at bedtime. (Patient not taking: Reported on 04/23/2020) 01/23/2019: PRN  . diphenhydrAMINE (BENADRYL) 25 mg capsule Take 1 capsule (25 mg total) by mouth every 8 (eight) hours as needed for itching. (Patient not taking: Reported on 04/23/2020)   . DM-Phenylephrine-Acetaminophen (VICKS DAYQUIL COLD & FLU) 10-5-325 MG CAPS Take 2 capsules by mouth daily as needed. (Patient not taking: Reported on 04/23/2020)   . docusate sodium (COLACE) 100 MG capsule Take 1 capsule (100 mg total) by mouth daily. (Patient not taking: Reported on 04/23/2020)   . esomeprazole (NEXIUM) 20 MG capsule Take 20 mg by mouth daily at 12 noon. (Patient not taking: Reported on 04/23/2020)   . fluticasone (FLONASE) 50 MCG/ACT nasal spray Place 1 spray into both nostrils daily as needed for allergies or rhinitis. (Patient not taking: Reported on 04/23/2020)   . Glucagon (BAQSIMI ONE PACK) 3 MG/DOSE POWD Place 1 Dose into the nose as needed. (Patient not taking: Reported on 04/23/2020)   . glucose blood (ONETOUCH VERIO) test strip Check blood sugar 6x daily (Patient not taking: Reported on 04/23/2020)   . HYDROcodone-acetaminophen (NORCO) 7.5-325 MG tablet Take 1 tablet by mouth every 6 (six) hours as needed for severe pain. (Patient not taking: Reported on 02/21/2020)   . Insulin Disposable Pump (OMNIPOD DASH 5 PACK PODS) MISC CHANGE EVERY 72 HOURS (Patient not taking: Reported on 04/23/2020)   . Insulin Infusion Pump  (T:SLIM X2 INS PUMP/CONTROL-IQ) DEVI  (Patient not taking: Reported on 04/23/2020)   . loratadine (CLARITIN) 10 MG tablet Take 10 mg by mouth daily as needed.  (Patient not taking: Reported on 04/23/2020)   . loratadine-pseudoephedrine (CLARITIN-D 24 HOUR) 10-240 MG 24 hr tablet Claritin-D 24 Hour 10 mg-240 mg tablet,extended release (Patient not taking: Reported on 04/23/2020)   . magnesium oxide (MAG-OX) 400 MG tablet Take 400 mg by mouth daily as needed.  (Patient not taking: Reported on 04/23/2020) 01/23/2019: PRN  . metFORMIN (GLUCOPHAGE-XR) 750 MG 24 hr tablet Take 750 mg by mouth daily as needed.  (Patient not taking: Reported on 04/23/2020) 08/14/2018: Patient still has if needed but has not had to take in several months  . polyethylene glycol (MIRALAX / GLYCOLAX) packet Take 17 g by mouth daily. Take while on narcotic pain medication to prevent constipation. (Patient  not taking: Reported on 04/23/2020)   . trimethoprim-polymyxin b (POLYTRIM) ophthalmic solution Place 1 drop into the left eye 3 (three) times daily. (Patient not taking: Reported on 04/23/2020)    No facility-administered encounter medications on file as of 04/23/2020.    Allergies: Allergies  Allergen Reactions  . Augmentin [Amoxicillin-Pot Clavulanate] Hives  . Doxycycline Hives    Surgical History: No past surgical history on file.  Family History:  Family History  Problem Relation Age of Onset  . Irritable bowel syndrome Mother   . Hyperlipidemia Father   . Hypertension Father   . Asthma Brother   . ADD / ADHD Brother   . Heart disease Maternal Grandmother   . Hyperlipidemia Maternal Grandmother   . Hypertension Maternal Grandmother   . Parkinson's disease Maternal Grandmother   . Heart attack Maternal Grandmother   . COPD Maternal Grandfather   . Hyperlipidemia Maternal Grandfather   . Hypertension Maternal Grandfather   . Anemia Maternal Grandfather   . Hyperlipidemia Paternal Grandmother   . Hypertension  Paternal Grandmother   . Migraines Paternal Grandmother   . Hyperlipidemia Paternal Grandfather   . Hypertension Paternal Grandfather   . Atrial fibrillation Paternal Grandfather       Social History: Lives with: mother and father Amada Kingfisher at [place]. Studying business as major.   Physical Exam:  Vitals:   04/23/20 1145  BP: 116/80  Pulse: 74  Weight: 159 lb 6.4 oz (72.3 kg)   BP 116/80   Pulse 74   Wt 159 lb 6.4 oz (72.3 kg)   BMI 23.00 kg/m  Body mass index: body mass index is 23 kg/m. Growth percentile SmartLinks can only be used for patients less than 74 years old.  Ht Readings from Last 3 Encounters:  02/21/20 5' 9.8" (1.773 m) (53 %, Z= 0.07)*  09/03/19 5' 9.76" (1.772 m) (53 %, Z= 0.07)*  05/01/19 5' 9.92" (1.776 m) (55 %, Z= 0.14)*   * Growth percentiles are based on CDC (Boys, 2-20 Years) data.   Wt Readings from Last 3 Encounters:  04/23/20 159 lb 6.4 oz (72.3 kg)  02/21/20 155 lb 6.4 oz (70.5 kg) (50 %, Z= 0.00)*  12/10/19 159 lb 6.4 oz (72.3 kg) (57 %, Z= 0.18)*   * Growth percentiles are based on CDC (Boys, 2-20 Years) data.   General: Well developed, well nourished male in no acute distress.  Head: Normocephalic, atraumatic.   Eyes:  Pupils equal and round. EOMI.  Sclera white.  No eye drainage.   Ears/Nose/Mouth/Throat: Nares patent, no nasal drainage.  Normal dentition, mucous membranes moist.  Neck: supple, no cervical lymphadenopathy, no thyromegaly Cardiovascular: regular rate, normal S1/S2, no murmurs Respiratory: No increased work of breathing.  Lungs clear to auscultation bilaterally.  No wheezes. Abdomen: soft, nontender, nondistended. Normal bowel sounds.  No appreciable masses  Extremities: warm, well perfused, cap refill < 2 sec.   Musculoskeletal: Normal muscle mass.  Normal strength Skin: warm, dry.  No rash or lesions. Neurologic: alert and oriented, normal speech, no tremor   Labs:  Lab Results  Component Value Date   HGBA1C 8.5  (A) 04/23/2020    Assessment/Plan: Otto is a 19 y.o. male with uncontrolled type 1 diabetes on Tslim insulin pump and Dexcom CGm. He is overriding recommended boluses and reducing the recommended dose which is causing more hyperglycemia. His hemoglobin A1c is 8.5% which is higher then ADA goal of <7%.   1. Uncontrolled type 1 diabetes mellitus with hyperglycemia (HCC)/ 2.  Hyperglycemia/ 3. Elevated hemoglobin A1c  - Reviewed insulin pump and CGM download. Discussed trends and patterns.  - Rotate pump sites to prevent scar tissue.  - bolus 15 minutes prior to eating to limit blood sugar spikes.  - Reviewed carb counting and importance of accurate carb counting.  - Discussed signs and symptoms of hypoglycemia. Always have glucose available.  - POCT glucose and hemoglobin A1c  - Reviewed growth chart.  - Advised to clean pump sites very well with alcohol prior to use. He can also try bathing with antibacterial soaps prior to pump insertion. Gave samples of steel T site to see if this helps reduce inflammation/infection.   4. Hypoglycemia unawareness  - Wear CGM and monitor glucose levels closely  - Discussed signs and symptoms.  - Keep glucose available at all times.   5. Maladaptive Behaviors.  - Discussed concerns and barriers to care  - Answered questions.   6. Insulin pump titration.  NO changes. Pump in place.    Follow-up:   3 months.   >45 spent today reviewing the medical chart, counseling the patient/family, and documenting today's visit.   When a patient is on insulin, intensive monitoring of blood glucose levels is necessary to avoid hyperglycemia and hypoglycemia. Severe hyperglycemia/hypoglycemia can lead to hospital admissions and be life threatening.     Gretchen Short,  FNP-C  Pediatric Specialist  [address]  [place], [address]  Tele: [phone]

## 2020-04-23 NOTE — Patient Instructions (Signed)
-   Try bathing with Dawn (antibacterial soap) for 3-4 days per week especially before pump site changes.  - Make sure to clean site well with alcohol swab   - If infection continues, likely would benefit from trying different infusion set.  - may need to see dermatology.   - Do not override boluses. Put your blood sugar and carbs into pump and give recommended amount  - If you start having lows then dowlnoad pump to Tconnects and send me a mychart message so I can make changes.   . Hemoglobin A1c is 8.5%.

## 2020-06-08 ENCOUNTER — Other Ambulatory Visit (INDEPENDENT_AMBULATORY_CARE_PROVIDER_SITE_OTHER): Payer: Self-pay | Admitting: Family

## 2020-07-31 ENCOUNTER — Other Ambulatory Visit (INDEPENDENT_AMBULATORY_CARE_PROVIDER_SITE_OTHER): Payer: Self-pay | Admitting: Family

## 2020-07-31 DIAGNOSIS — E1065 Type 1 diabetes mellitus with hyperglycemia: Secondary | ICD-10-CM

## 2020-08-03 ENCOUNTER — Telehealth (INDEPENDENT_AMBULATORY_CARE_PROVIDER_SITE_OTHER): Payer: Self-pay

## 2020-08-03 ENCOUNTER — Ambulatory Visit (INDEPENDENT_AMBULATORY_CARE_PROVIDER_SITE_OTHER): Payer: BC Managed Care – PPO | Admitting: Family

## 2020-08-03 NOTE — Telephone Encounter (Signed)
[name] Key: [id number] - PA Case ID: [id number] Need help? Call us at [phone] Outcome Approvedtoday Your PA request has been approved. Additional information will be provided in the approval communication. (Message 1145) Drug Dexcom G6 Sensor Form [employer] Electronic PA Form [phone] NCPDP)

## 2020-08-05 ENCOUNTER — Encounter (INDEPENDENT_AMBULATORY_CARE_PROVIDER_SITE_OTHER): Payer: Self-pay

## 2020-08-05 ENCOUNTER — Ambulatory Visit (INDEPENDENT_AMBULATORY_CARE_PROVIDER_SITE_OTHER): Payer: Self-pay | Admitting: Family

## 2020-08-05 NOTE — Progress Notes (Deleted)
Pediatric Endocrinology Diabetes follow up Consultation Visit  MACOY RODWELL 09/17/2000 [id number]  Chief Complaint:  Type 1 Diabetes    Dettinger, Elige Radon, MD   HPI: James Yu  is a 20 y.o. male presenting for follow-up of Type 1 Diabetes   he is accompanied to this visit by his mother.  1. [name] was diagnosed with type 1 diabetes at 20 years of age. He had been followed by [facility] since that time. He was initially on MDI and the transitioned to [facility] insulin pump. During his time with diabetes he has switch back to MDI but struggled with consistency. He is currently using an OMnipod insulin pump and Freestyle libre. He does not feel like his diabetes care has been very good over the past 2-3 year.   2. Since his last visit to clinic on 03/2020, he has been well. No ER of hospital visits.   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   He is wearing Tslim insulin pump and Dexcom CGM. Reports that they are working "pretty good" except for occasional problems with sites. He finds that he will get a red bump and occasionally has discharge (looks like pus). He reports that he is cleaning with alcohol swabs prior to applying site. Feels like this is why he occasionally has elevated blood sugars.   He acknowledges that he does not bolus "as often as he is suppose to". He frequently snacks and forgets to bolus. He tries to bolus before eating when he eats meals. He will also do override bolus instead of entering blood sugars and carbs.      Insulin regimen: Omnipod Insulin Pump  Basal Rates 12AM 1.20   7am 1.47  10am 1.67        36.9 u/day   Insulin to Carbohydrate Ratio 12AM 12  7am  5  4pm 5          Insulin Sensitivity Factor 12AM  45         Target Blood Glucose 12AM 140  7am 120            Hypoglycemia: cannot feel most low blood  sugars.  No glucagon needed recently.  Insulin pump CGm download   Med-alert ID: is not currently wearing. Injection/Pump sites: arms, abdomen.  Annual labs due: 11/2020 Ophthalmology due: 2021.  Reminded to get annual dilated eye exam    3. ROS: Greater than 10 systems reviewed with pertinent positives listed in HPI, otherwise neg. Constitutional: Sleeping well. WEight is stable.  Eyes: No changes in vision Ears/Nose/Mouth/Throat: No difficulty swallowing. Cardiovascular: No palpitations Respiratory: No increased work of breathing Gastrointestinal: No constipation or diarrhea. No abdominal pain Genitourinary: No nocturia, no polyuria Musculoskeletal: No joint pain Neurologic: Normal sensation, no tremor Endocrine: No polydipsia.  No hyperpigmentation Psychiatric: Normal affect  Past Medical History:   Past Medical History:  Diagnosis Date  . Allergy   . Diabetes mellitus without complication (HCC) 08/2003  . Irritable bowel syndrome (IBS)     Medications:  Outpatient Encounter Medications as of 08/05/2020  Medication Sig Note  . acetaminophen (TYLENOL) 325 MG tablet Take 2 tablets (650 mg total) by mouth every 6 (six) hours as needed for mild pain. (Patient not taking: Reported on 04/23/2020)   . B Complex Vitamins (VITAMIN-B COMPLEX) TABS Take 1 capsule by mouth daily. (Patient not taking: Reported on 04/23/2020)   .  BD PEN NEEDLE NANO U/F 32G X 4 MM MISC USE UP TO 6 TIMES DAILY (Patient not taking: Reported on 04/23/2020)   . BESIVANCE 0.6 % SUSP Place 1 drop into the left eye 3 (three) times daily. (Patient not taking: Reported on 04/23/2020)   . cefdinir (OMNICEF) 300 MG capsule Take 1 capsule (300 mg total) by mouth 2 (two) times daily. 1 po BID (Patient not taking: Reported on 04/23/2020)   . Continuous Blood Gluc Sensor (DEXCOM G6 SENSOR) MISC CHANGE EVERY 10 DAYS   . Continuous Blood Gluc Sensor (FREESTYLE LIBRE 14 DAY SENSOR) MISC Inject into the skin. (Patient not taking:  Reported on 04/23/2020)   . Continuous Blood Gluc Transmit (DEXCOM G6 TRANSMITTER) MISC 1 Units by Does not apply route every 3 (three) months. Change transmitter every 3 months (Patient not taking: Reported on 04/23/2020)   . dicyclomine (BENTYL) 10 MG capsule Take 1 capsule (10 mg total) by mouth 4 (four) times daily -  before meals and at bedtime. (Patient not taking: Reported on 04/23/2020) 01/23/2019: PRN  . diphenhydrAMINE (BENADRYL) 25 mg capsule Take 1 capsule (25 mg total) by mouth every 8 (eight) hours as needed for itching. (Patient not taking: Reported on 04/23/2020)   . DM-Phenylephrine-Acetaminophen (VICKS DAYQUIL COLD & FLU) 10-5-325 MG CAPS Take 2 capsules by mouth daily as needed. (Patient not taking: Reported on 04/23/2020)   . docusate sodium (COLACE) 100 MG capsule Take 1 capsule (100 mg total) by mouth daily. (Patient not taking: Reported on 04/23/2020)   . esomeprazole (NEXIUM) 20 MG capsule Take 20 mg by mouth daily at 12 noon. (Patient not taking: Reported on 04/23/2020)   . fluticasone (FLONASE) 50 MCG/ACT nasal spray Place 1 spray into both nostrils daily as needed for allergies or rhinitis. (Patient not taking: Reported on 04/23/2020)   . Glucagon (BAQSIMI ONE PACK) 3 MG/DOSE POWD Place 1 Dose into the nose as needed. (Patient not taking: Reported on 04/23/2020)   . glucose blood (ONETOUCH VERIO) test strip Check blood sugar 6x daily (Patient not taking: Reported on 04/23/2020)   . HYDROcodone-acetaminophen (NORCO) 7.5-325 MG tablet Take 1 tablet by mouth every 6 (six) hours as needed for severe pain. (Patient not taking: Reported on 02/21/2020)   . insulin aspart (NOVOLOG) 100 UNIT/ML injection INJECT 300 UNITS INTO THE PUMP EVERY 48 HOURS   . Insulin Disposable Pump (OMNIPOD DASH 5 PACK PODS) MISC CHANGE EVERY 72 HOURS (Patient not taking: Reported on 04/23/2020)   . Insulin Infusion Pump (T:SLIM X2 INS PUMP/CONTROL-IQ) DEVI  (Patient not taking: Reported on 04/23/2020)   . loratadine  (CLARITIN) 10 MG tablet Take 10 mg by mouth daily as needed.  (Patient not taking: Reported on 04/23/2020)   . loratadine-pseudoephedrine (CLARITIN-D 24 HOUR) 10-240 MG 24 hr tablet Claritin-D 24 Hour 10 mg-240 mg tablet,extended release (Patient not taking: Reported on 04/23/2020)   . magnesium oxide (MAG-OX) 400 MG tablet Take 400 mg by mouth daily as needed.  (Patient not taking: Reported on 04/23/2020) 01/23/2019: PRN  . metFORMIN (GLUCOPHAGE-XR) 750 MG 24 hr tablet Take 750 mg by mouth daily as needed.  (Patient not taking: Reported on 04/23/2020) 08/14/2018: Patient still has if needed but has not had to take in several months  . polyethylene glycol (MIRALAX / GLYCOLAX) packet Take 17 g by mouth daily. Take while on narcotic pain medication to prevent constipation. (Patient not taking: Reported on 04/23/2020)   . trimethoprim-polymyxin b (POLYTRIM) ophthalmic solution Place 1 drop into the left  eye 3 (three) times daily. (Patient not taking: Reported on 04/23/2020)    No facility-administered encounter medications on file as of 08/05/2020.    Allergies: Allergies  Allergen Reactions  . Augmentin [Amoxicillin-Pot Clavulanate] Hives  . Doxycycline Hives    Surgical History: No past surgical history on file.  Family History:  Family History  Problem Relation Age of Onset  . Irritable bowel syndrome Mother   . Hyperlipidemia Father   . Hypertension Father   . Asthma Brother   . ADD / ADHD Brother   . Heart disease Maternal Grandmother   . Hyperlipidemia Maternal Grandmother   . Hypertension Maternal Grandmother   . Parkinson's disease Maternal Grandmother   . Heart attack Maternal Grandmother   . COPD Maternal Grandfather   . Hyperlipidemia Maternal Grandfather   . Hypertension Maternal Grandfather   . Anemia Maternal Grandfather   . Hyperlipidemia Paternal Grandmother   . Hypertension Paternal Grandmother   . Migraines Paternal Grandmother   . Hyperlipidemia Paternal Grandfather   .  Hypertension Paternal Grandfather   . Atrial fibrillation Paternal Grandfather       Social History: Lives with: mother and father Amada Kingfisher at [place]. Studying business as major.   Physical Exam:  There were no vitals filed for this visit. There were no vitals taken for this visit. Body mass index: body mass index is unknown because there is no height or weight on file. Growth percentile SmartLinks can only be used for patients less than 34 years old.  Ht Readings from Last 3 Encounters:  02/21/20 5' 9.8" (1.773 m) (53 %, Z= 0.07)*  09/03/19 5' 9.76" (1.772 m) (53 %, Z= 0.07)*  05/01/19 5' 9.92" (1.776 m) (55 %, Z= 0.14)*   * Growth percentiles are based on CDC (Boys, 2-20 Years) data.   Wt Readings from Last 3 Encounters:  04/23/20 159 lb 6.4 oz (72.3 kg)  02/21/20 155 lb 6.4 oz (70.5 kg) (50 %, Z= 0.00)*  12/10/19 159 lb 6.4 oz (72.3 kg) (57 %, Z= 0.18)*   * Growth percentiles are based on CDC (Boys, 2-20 Years) data.   General: Well developed, well nourished male in no acute distress.   Head: Normocephalic, atraumatic.   Eyes:  Pupils equal and round. EOMI.  Sclera white.  No eye drainage.   Ears/Nose/Mouth/Throat: Nares patent, no nasal drainage.  Normal dentition, mucous membranes moist.  Neck: supple, no cervical lymphadenopathy, no thyromegaly Cardiovascular: regular rate, normal S1/S2, no murmurs Respiratory: No increased work of breathing.  Lungs clear to auscultation bilaterally.  No wheezes. Abdomen: soft, nontender, nondistended. Normal bowel sounds.  No appreciable masses  Extremities: warm, well perfused, cap refill < 2 sec.   Musculoskeletal: Normal muscle mass.  Normal strength Skin: warm, dry.  No rash or lesions. Neurologic: alert and oriented, normal speech, no tremor    Labs:  Lab Results  Component Value Date   HGBA1C 8.5 (A) 04/23/2020    Assessment/Plan: Juandaniel is a 20 y.o. male with uncontrolled type 1 diabetes on Tslim insulin pump and  Dexcom CGm. He is overriding recommended boluses and reducing the recommended dose which is causing more hyperglycemia. His hemoglobin A1c is 8.5% which is higher then ADA goal of <7%.   1. Uncontrolled type 1 diabetes mellitus with hyperglycemia (HCC)/ 2. Hyperglycemia/ 3. Elevated hemoglobin A1c  - Reviewed insulin pump and CGM download. Discussed trends and patterns.  - Rotate pump sites to prevent scar tissue.  - bolus 15 minutes prior to eating to  limit blood sugar spikes.  - Reviewed carb counting and importance of accurate carb counting.  - Discussed signs and symptoms of hypoglycemia. Always have glucose available.  - POCT glucose and hemoglobin A1c  - Reviewed growth chart.   4. Hypoglycemia unawareness  - Wear CGM and monitor glucose levels closely  - Discussed signs and symptoms.  - Keep glucose available at all times.   5. Maladaptive Behaviors.  - Discussed concerns and balancing diabetes care with school, work and activity  - Answered questions.   6. Insulin pump titration.  NO changes. Pump in place.    Follow-up:   3 months.   >45 spent today reviewing the medical chart, counseling the patient/family, and documenting today's visit.   When a patient is on insulin, intensive monitoring of blood glucose levels is necessary to avoid hyperglycemia and hypoglycemia. Severe hyperglycemia/hypoglycemia can lead to hospital admissions and be life threatening.     Gretchen Short,  FNP-C  Pediatric Specialist  [address]  [place], [address]  Tele: [phone]

## 2020-08-06 ENCOUNTER — Ambulatory Visit (INDEPENDENT_AMBULATORY_CARE_PROVIDER_SITE_OTHER): Payer: BC Managed Care – PPO | Admitting: Family

## 2020-08-06 ENCOUNTER — Encounter (INDEPENDENT_AMBULATORY_CARE_PROVIDER_SITE_OTHER): Payer: Self-pay | Admitting: Family

## 2020-08-06 ENCOUNTER — Other Ambulatory Visit: Payer: Self-pay

## 2020-08-06 VITALS — BP 108/68 | HR 72 | Wt 158.6 lb

## 2020-08-06 DIAGNOSIS — R739 Hyperglycemia, unspecified: Secondary | ICD-10-CM

## 2020-08-06 DIAGNOSIS — R7309 Other abnormal glucose: Secondary | ICD-10-CM | POA: Diagnosis not present

## 2020-08-06 DIAGNOSIS — F54 Psychological and behavioral factors associated with disorders or diseases classified elsewhere: Secondary | ICD-10-CM | POA: Diagnosis not present

## 2020-08-06 DIAGNOSIS — E1065 Type 1 diabetes mellitus with hyperglycemia: Secondary | ICD-10-CM

## 2020-08-06 DIAGNOSIS — Z9641 Presence of insulin pump (external) (internal): Secondary | ICD-10-CM

## 2020-08-06 DIAGNOSIS — E10649 Type 1 diabetes mellitus with hypoglycemia without coma: Secondary | ICD-10-CM | POA: Diagnosis not present

## 2020-08-06 LAB — POCT GLYCOSYLATED HEMOGLOBIN (HGB A1C): Hemoglobin A1C: 8.5 % — AB (ref 4.0–5.6)

## 2020-08-06 LAB — POCT GLUCOSE (DEVICE FOR HOME USE): POC Glucose: 199 mg/dl — AB (ref 70–99)

## 2020-08-06 MED ORDER — DEXCOM G6 TRANSMITTER MISC: 1.0000 [IU] | 4 refills | Status: DC | PRN

## 2020-08-06 NOTE — Patient Instructions (Signed)
.   YOU NEED TO BOLUS!!! For snacks, for meals for high blood sugars.  - On average you are only giving 23% of your total insulin   -  Hypoglycemia  . Shaking or trembling. . Sweating and chills. . Dizziness or lightheadedness. . Faster heart rate. Marland Kitchen Headaches. . Hunger. . Nausea. . Nervousness or irritability. . Pale skin. Marland Kitchen Restless sleep. . Weakness. Kennis Carina vision. . Confusion or trouble concentrating. . Sleepiness. . Slurred speech. . Tingling or numbness in the face or mouth.  How do I treat an episode of hypoglycemia? The American Diabetes Association recommends the "15-15 rule" for an episode of hypoglycemia: . Eat or drink 15 grams of carbs to raise your blood sugar. . After 15 minutes, check your blood sugar. . If it's still below 70 mg/dL, have another 15 grams of carbs. . Repeat until your blood sugar is at least 70 mg/dL.  Hyperglycemia  . Frequent urination . Increased thirst . Blurred vision . Fatigue . Headache Diabetic Ketoacidosis (DKA)  If hyperglycemia goes untreated, it can cause toxic acids (ketones) to build up in your blood and urine (ketoacidosis). Signs and symptoms include: . Fruity-smelling breath . Nausea and vomiting . Shortness of breath . Dry mouth . Weakness . Confusion . Coma . Abdominal pain        Sick day/Ketones Protocol  . Check blood glucose every 2 hours  . Check urine ketones every 2 hours (until ketones are clear)  . Drink plenty of fluids (water, Pedialyte) hourly . Give rapid acting insulin correction dose every 3 hours until ketones are clear  . Notify clinic of sickness/ketones  . If you develop signs of DKA, go to ER immediately.   Hemoglobin A1c levels

## 2020-08-06 NOTE — Progress Notes (Signed)
Pediatric Endocrinology Diabetes follow up Consultation Visit  James Yu 2000/09/23 [id number]  Chief Complaint:  Type 1 Diabetes    Dettinger, Elige Radon, MD   HPI: James Yu  is a 20 y.o. male presenting for follow-up of Type 1 Diabetes   he is accompanied to this visit by his mother.  1. [name] was diagnosed with type 1 diabetes at 20 years of age. He had been followed by [facility] since that time. He was initially on MDI and the transitioned to [facility] insulin pump. During his time with diabetes he has switch back to MDI but struggled with consistency. He is currently using an OMnipod insulin pump and Freestyle libre. He does not feel like his diabetes care has been very good over the past 2-3 year.   2. Since his last visit to clinic on 03/2020, he has been well. No ER of hospital visits.   He just got back from a trip to [place] to see friends. Starts back at [facility] on Monday. Still working at the [employer] 3-4 days per week.   Using Tslim insulin pump with control IQ. He states that he has not been bolusing because he is eating snacks instead of meals and forgets. He has a lot of high blood sugars. Wants to make improvements but has a hard time changing his habits.    Concerns:  Malfunction errors with TSlim insulin pump. He had one pump replaced.  Frequently forgets to bolus     Insulin regimen: Omnipod Insulin Pump  Basal Rates 12AM 1.20   7am 1.47  10am 1.67        36.9 u/day   Insulin to Carbohydrate Ratio 12AM 12  7am  5  4pm 5          Insulin Sensitivity Factor 12AM  45         Target Blood Glucose 12AM 140  7am 120            Hypoglycemia: cannot feel most low blood sugars.  No glucagon needed recently.  Insulin pump CGm download   Med-alert ID: is not currently wearing. Injection/Pump sites: arms, abdomen.  Annual labs due: 11/2020 Ophthalmology due: 2021.  Reminded to get annual dilated eye exam    3.  ROS: Greater than 10 systems reviewed with pertinent positives listed in HPI, otherwise neg. Constitutional: Sleeping well. Weight is stable.  Eyes: No changes in vision Ears/Nose/Mouth/Throat: No difficulty swallowing. Cardiovascular: No palpitations Respiratory: No increased work of breathing Gastrointestinal: No constipation or diarrhea. No abdominal pain Genitourinary: No nocturia, no polyuria Musculoskeletal: No joint pain Neurologic: Normal sensation, no tremor Endocrine: No polydipsia.  No hyperpigmentation Psychiatric: Normal affect  Past Medical History:   Past Medical History:  Diagnosis Date  . Allergy   . Diabetes mellitus without complication (HCC) 08/2003  . Irritable bowel syndrome (IBS)     Medications:  Outpatient Encounter Medications as of 08/06/2020  Medication Sig Note  . Continuous Blood Gluc Sensor (DEXCOM G6 SENSOR) MISC CHANGE EVERY 10 DAYS   . Continuous Blood Gluc Transmit (DEXCOM G6 TRANSMITTER) MISC 1 Units by Does not apply route as needed.   . insulin aspart (NOVOLOG) 100 UNIT/ML injection INJECT 300 UNITS INTO THE PUMP EVERY 48 HOURS   . acetaminophen (TYLENOL) 325 MG tablet Take 2 tablets (650 mg total) by mouth every 6 (six) hours as needed for mild pain. (Patient not taking: Reported on 04/23/2020)   . B Complex Vitamins (VITAMIN-B COMPLEX) TABS Take  1 capsule by mouth daily. (Patient not taking: Reported on 04/23/2020)   . BD PEN NEEDLE NANO U/F 32G X 4 MM MISC USE UP TO 6 TIMES DAILY (Patient not taking: Reported on 04/23/2020)   . BESIVANCE 0.6 % SUSP Place 1 drop into the left eye 3 (three) times daily. (Patient not taking: Reported on 04/23/2020)   . cefdinir (OMNICEF) 300 MG capsule Take 1 capsule (300 mg total) by mouth 2 (two) times daily. 1 po BID (Patient not taking: Reported on 04/23/2020)   . Continuous Blood Gluc Sensor (FREESTYLE LIBRE 14 DAY SENSOR) MISC Inject into the skin. (Patient not taking: Reported on 04/23/2020)   . dicyclomine  (BENTYL) 10 MG capsule Take 1 capsule (10 mg total) by mouth 4 (four) times daily -  before meals and at bedtime. (Patient not taking: Reported on 04/23/2020) 01/23/2019: PRN  . diphenhydrAMINE (BENADRYL) 25 mg capsule Take 1 capsule (25 mg total) by mouth every 8 (eight) hours as needed for itching. (Patient not taking: Reported on 04/23/2020)   . DM-Phenylephrine-Acetaminophen (VICKS DAYQUIL COLD & FLU) 10-5-325 MG CAPS Take 2 capsules by mouth daily as needed. (Patient not taking: Reported on 04/23/2020)   . docusate sodium (COLACE) 100 MG capsule Take 1 capsule (100 mg total) by mouth daily. (Patient not taking: Reported on 04/23/2020)   . esomeprazole (NEXIUM) 20 MG capsule Take 20 mg by mouth daily at 12 noon. (Patient not taking: Reported on 04/23/2020)   . fluticasone (FLONASE) 50 MCG/ACT nasal spray Place 1 spray into both nostrils daily as needed for allergies or rhinitis. (Patient not taking: Reported on 04/23/2020)   . Glucagon (BAQSIMI ONE PACK) 3 MG/DOSE POWD Place 1 Dose into the nose as needed. (Patient not taking: Reported on 04/23/2020)   . glucose blood (ONETOUCH VERIO) test strip Check blood sugar 6x daily (Patient not taking: Reported on 04/23/2020)   . HYDROcodone-acetaminophen (NORCO) 7.5-325 MG tablet Take 1 tablet by mouth every 6 (six) hours as needed for severe pain. (Patient not taking: Reported on 02/21/2020)   . Insulin Disposable Pump (OMNIPOD DASH 5 PACK PODS) MISC CHANGE EVERY 72 HOURS (Patient not taking: Reported on 04/23/2020)   . Insulin Infusion Pump (T:SLIM X2 INS PUMP/CONTROL-IQ) DEVI  (Patient not taking: Reported on 04/23/2020)   . loratadine (CLARITIN) 10 MG tablet Take 10 mg by mouth daily as needed.  (Patient not taking: Reported on 04/23/2020)   . loratadine-pseudoephedrine (CLARITIN-D 24 HOUR) 10-240 MG 24 hr tablet Claritin-D 24 Hour 10 mg-240 mg tablet,extended release (Patient not taking: Reported on 04/23/2020)   . magnesium oxide (MAG-OX) 400 MG tablet Take 400 mg by  mouth daily as needed.  (Patient not taking: Reported on 04/23/2020) 01/23/2019: PRN  . metFORMIN (GLUCOPHAGE-XR) 750 MG 24 hr tablet Take 750 mg by mouth daily as needed.  (Patient not taking: Reported on 04/23/2020) 08/14/2018: Patient still has if needed but has not had to take in several months  . polyethylene glycol (MIRALAX / GLYCOLAX) packet Take 17 g by mouth daily. Take while on narcotic pain medication to prevent constipation. (Patient not taking: Reported on 04/23/2020)   . trimethoprim-polymyxin b (POLYTRIM) ophthalmic solution Place 1 drop into the left eye 3 (three) times daily. (Patient not taking: Reported on 04/23/2020)   . [DISCONTINUED] Continuous Blood Gluc Transmit (DEXCOM G6 TRANSMITTER) MISC 1 Units by Does not apply route every 3 (three) months. Change transmitter every 3 months (Patient not taking: Reported on 04/23/2020)    No facility-administered encounter medications  on file as of 08/06/2020.    Allergies: Allergies  Allergen Reactions  . Augmentin [Amoxicillin-Pot Clavulanate] Hives  . Doxycycline Hives    Surgical History: No past surgical history on file.  Family History:  Family History  Problem Relation Age of Onset  . Irritable bowel syndrome Mother   . Hyperlipidemia Father   . Hypertension Father   . Asthma Brother   . ADD / ADHD Brother   . Heart disease Maternal Grandmother   . Hyperlipidemia Maternal Grandmother   . Hypertension Maternal Grandmother   . Parkinson's disease Maternal Grandmother   . Heart attack Maternal Grandmother   . COPD Maternal Grandfather   . Hyperlipidemia Maternal Grandfather   . Hypertension Maternal Grandfather   . Anemia Maternal Grandfather   . Hyperlipidemia Paternal Grandmother   . Hypertension Paternal Grandmother   . Migraines Paternal Grandmother   . Hyperlipidemia Paternal Grandfather   . Hypertension Paternal Grandfather   . Atrial fibrillation Paternal Grandfather       Social History: Lives with: mother  and father Amada Kingfisher at [place]. Studying business as major.   Physical Exam:  Vitals:   08/06/20 0944  BP: 108/68  Pulse: 72  Weight: 158 lb 9.6 oz (71.9 kg)   BP 108/68   Pulse 72   Wt 158 lb 9.6 oz (71.9 kg)   BMI 22.89 kg/m  Body mass index: body mass index is 22.89 kg/m. Growth percentile SmartLinks can only be used for patients less than 23 years old.  Ht Readings from Last 3 Encounters:  02/21/20 5' 9.8" (1.773 m) (53 %, Z= 0.07)*  09/03/19 5' 9.76" (1.772 m) (53 %, Z= 0.07)*  05/01/19 5' 9.92" (1.776 m) (55 %, Z= 0.14)*   * Growth percentiles are based on CDC (Boys, 2-20 Years) data.   Wt Readings from Last 3 Encounters:  08/06/20 158 lb 9.6 oz (71.9 kg)  04/23/20 159 lb 6.4 oz (72.3 kg)  02/21/20 155 lb 6.4 oz (70.5 kg) (50 %, Z= 0.00)*   * Growth percentiles are based on CDC (Boys, 2-20 Years) data.   General: Well developed, well nourished male in no acute distress.   Head: Normocephalic, atraumatic.   Eyes:  Pupils equal and round. EOMI.  Sclera white.  No eye drainage.   Ears/Nose/Mouth/Throat: Nares patent, no nasal drainage.  Normal dentition, mucous membranes moist.  Neck: supple, no cervical lymphadenopathy, no thyromegaly Cardiovascular: regular rate, normal S1/S2, no murmurs Respiratory: No increased work of breathing.  Lungs clear to auscultation bilaterally.  No wheezes. Abdomen: soft, nontender, nondistended. Normal bowel sounds.  No appreciable masses  Extremities: warm, well perfused, cap refill < 2 sec.   Musculoskeletal: Normal muscle mass.  Normal strength Skin: warm, dry.  No rash or lesions. Neurologic: alert and oriented, normal speech, no tremor    Labs:  Lab Results  Component Value Date   HGBA1C 8.5 (A) 08/06/2020    Assessment/Plan: Eleno is a 20 y.o. male with uncontrolled type 1 diabetes on Tslim insulin pump and Dexcom CGm. He is having high variability and frequent hyperglycemia due to skipping boluses after eating. His  hemoglobin A1c is 8.5% which is higher then ADA goal of <7%   1. Uncontrolled type 1 diabetes mellitus with hyperglycemia (HCC)/ 2. Hyperglycemia/ 3. Elevated hemoglobin A1c  - Reviewed insulin pump and CGM download. Discussed trends and patterns.  - Rotate pump sites to prevent scar tissue.  - bolus 15 minutes prior to eating to limit blood sugar spikes.  -  Reviewed carb counting and importance of accurate carb counting.  - Discussed signs and symptoms of hypoglycemia. Always have glucose available.  - POCT glucose and hemoglobin A1c  - Reviewed growth chart.  - Stressed importance of bolusing for all carb intake. Discussed setting reminders.  - Prescribed Dexcom transmitter.   4. Hypoglycemia unawareness  - Wear CGM and monitor glucose levels closely  - Discussed signs and symptoms.  - Keep glucose available at all times.   5. Maladaptive Behaviors.  - Discussed concerns and balancing diabetes care with school, work and activity  - Answered questions.   6. Insulin pump titration.  NO changes. Needs to bolus    Follow-up:   3 months.    >30 spent today reviewing the medical chart, counseling the patient/family, and documenting today's visit.  When a patient is on insulin, intensive monitoring of blood glucose levels is necessary to avoid hyperglycemia and hypoglycemia. Severe hyperglycemia/hypoglycemia can lead to hospital admissions and be life threatening.     Gretchen Short,  FNP-C  Pediatric Specialist  [address]  [place], [address]  Tele: [phone]

## 2020-11-04 ENCOUNTER — Encounter (INDEPENDENT_AMBULATORY_CARE_PROVIDER_SITE_OTHER): Payer: Self-pay | Admitting: Family

## 2020-11-04 ENCOUNTER — Ambulatory Visit (INDEPENDENT_AMBULATORY_CARE_PROVIDER_SITE_OTHER): Payer: BC Managed Care – PPO | Admitting: Family

## 2020-11-04 ENCOUNTER — Other Ambulatory Visit: Payer: Self-pay

## 2020-11-04 VITALS — BP 114/72 | HR 80 | Wt 164.2 lb

## 2020-11-04 DIAGNOSIS — Z9641 Presence of insulin pump (external) (internal): Secondary | ICD-10-CM

## 2020-11-04 DIAGNOSIS — R739 Hyperglycemia, unspecified: Secondary | ICD-10-CM

## 2020-11-04 DIAGNOSIS — E10649 Type 1 diabetes mellitus with hypoglycemia without coma: Secondary | ICD-10-CM

## 2020-11-04 DIAGNOSIS — R7309 Other abnormal glucose: Secondary | ICD-10-CM

## 2020-11-04 DIAGNOSIS — E1065 Type 1 diabetes mellitus with hyperglycemia: Secondary | ICD-10-CM

## 2020-11-04 LAB — POCT GLYCOSYLATED HEMOGLOBIN (HGB A1C): Hemoglobin A1C: 8.5 % — AB (ref 4.0–5.6)

## 2020-11-04 LAB — POCT GLUCOSE (DEVICE FOR HOME USE): POC Glucose: 190 mg/dl — AB (ref 70–99)

## 2020-11-04 NOTE — Patient Instructions (Signed)

## 2020-11-04 NOTE — Progress Notes (Signed)
Pediatric Yu Diabetes follow up Consultation Visit  James Yu [birth year] [id number]  Chief Complaint:  Type 1 Diabetes    Dettinger, Elige Radon, MD   HPI: James Yu  is a 20 y.o. male presenting for follow-up of Type 1 Diabetes   he is accompanied to this visit by his mother.  1. [name] was diagnosed with type 1 diabetes at 20 years of age. He had been followed by James Yu since that time. He was initially on MDI and the transitioned to [facility] insulin pump. During his time with diabetes he has switch back to MDI but struggled with consistency. He is currently using an OMnipod insulin pump and Freestyle libre. He does not feel like his diabetes care has been very good over the past 2-3 year.   2. Since his last visit to clinic on 07/2020, he has been well. No ER of hospital visits.   School is going well. He is staying active playing basketball and flag football a few days per week.   He recently broke his Tslim insulin pump playing basketball. He has started taking his pump off during activity and his blood sugars are running high during the time it is off. Pump sites have been working well, rarely has failed sites. He states that he "sometimes" boluses when he eats but frequently gets distracted. Hypoglycemia is rare.   Concerns:  - blood sugars during activity.    Insulin regimen: Omnipod Insulin Pump  Basal Rates 12AM 1.20   7am 1.47  10am 1.67        36.9 u/day   Insulin to Carbohydrate Ratio 12AM 12  7am  5  4pm 5          Insulin Sensitivity Factor 12AM  45         Target Blood Glucose 12AM 140  7am 120            Hypoglycemia: cannot feel most low blood sugars.  No glucagon needed recently.  Insulin pump CGm download   Med-alert ID: is not currently wearing. Injection/Pump sites: arms, abdomen.  Annual labs due: 11/2020 Ophthalmology due: 11/2020     3. ROS: Greater than 10 systems reviewed with pertinent  positives listed in HPI, otherwise neg. Constitutional: Sleeping well. 6 lbs weight gain  Eyes: No changes in vision Ears/Nose/Mouth/Throat: No difficulty swallowing. Cardiovascular: No palpitations Respiratory: No increased work of breathing Gastrointestinal: No constipation or diarrhea. No abdominal pain Genitourinary: No nocturia, no polyuria Musculoskeletal: No joint pain Neurologic: Normal sensation, no tremor Endocrine: No polydipsia.  No hyperpigmentation Psychiatric: Normal affect  Past Medical History:   Past Medical History:  Diagnosis Date  . Allergy   . Diabetes mellitus without complication (HCC) 08/2003  . Irritable bowel syndrome (IBS)     Medications:  Outpatient Encounter Medications as of 11/04/2020  Medication Sig Note  . insulin aspart (NOVOLOG) 100 UNIT/ML injection INJECT 300 UNITS INTO THE PUMP EVERY 48 HOURS   . acetaminophen (TYLENOL) 325 MG tablet Take 2 tablets (650 mg total) by mouth every 6 (six) hours as needed for mild pain. (Patient not taking: No sig reported)   . B Complex Vitamins (VITAMIN-B COMPLEX) TABS Take 1 capsule by mouth daily. (Patient not taking: No sig reported)   . BD PEN NEEDLE NANO U/F 32G X 4 MM MISC USE UP TO 6 TIMES DAILY (Patient not taking: No sig reported)   . BESIVANCE 0.6 % SUSP Place 1 drop into the left eye 3 (three) times daily. (  Patient not taking: No sig reported)   . cefdinir (OMNICEF) 300 MG capsule Take 1 capsule (300 mg total) by mouth 2 (two) times daily. 1 po BID (Patient not taking: No sig reported)   . Continuous Blood Gluc Sensor (DEXCOM G6 SENSOR) MISC CHANGE EVERY 10 DAYS (Patient not taking: Reported on 11/04/2020)   . Continuous Blood Gluc Sensor (FREESTYLE LIBRE 14 DAY SENSOR) MISC Inject into the skin. (Patient not taking: No sig reported)   . Continuous Blood Gluc Transmit (DEXCOM G6 TRANSMITTER) MISC 1 Units by Does not apply route as needed. (Patient not taking: Reported on 11/04/2020)   . dicyclomine  (BENTYL) 10 MG capsule Take 1 capsule (10 mg total) by mouth 4 (four) times daily -  before meals and at bedtime. (Patient not taking: No sig reported) 01/23/2019: PRN  . diphenhydrAMINE (BENADRYL) 25 mg capsule Take 1 capsule (25 mg total) by mouth every 8 (eight) hours as needed for itching. (Patient not taking: No sig reported)   . DM-Phenylephrine-Acetaminophen 10-5-325 MG CAPS Take 2 capsules by mouth daily as needed. (Patient not taking: No sig reported)   . docusate sodium (COLACE) 100 MG capsule Take 1 capsule (100 mg total) by mouth daily. (Patient not taking: No sig reported)   . esomeprazole (NEXIUM) 20 MG capsule Take 20 mg by mouth daily at 12 noon. (Patient not taking: No sig reported)   . fluticasone (FLONASE) 50 MCG/ACT nasal spray Place 1 spray into both nostrils daily as needed for allergies or rhinitis. (Patient not taking: No sig reported)   . Glucagon (BAQSIMI ONE PACK) 3 MG/DOSE POWD Place 1 Dose into the nose as needed. (Patient not taking: No sig reported)   . glucose blood (ONETOUCH VERIO) test strip Check blood sugar 6x daily (Patient not taking: No sig reported)   . HYDROcodone-acetaminophen (NORCO) 7.5-325 MG tablet Take 1 tablet by mouth every 6 (six) hours as needed for severe pain. (Patient not taking: No sig reported)   . Insulin Disposable Pump (OMNIPOD DASH 5 PACK PODS) MISC CHANGE EVERY 72 HOURS (Patient not taking: No sig reported)   . Insulin Infusion Pump (T:SLIM X2 INS PUMP/CONTROL-IQ) DEVI  (Patient not taking: No sig reported)   . loratadine (CLARITIN) 10 MG tablet Take 10 mg by mouth daily as needed.  (Patient not taking: No sig reported)   . loratadine-pseudoephedrine (CLARITIN-D 24 HOUR) 10-240 MG 24 hr tablet Claritin-D 24 Hour 10 mg-240 mg tablet,extended release (Patient not taking: No sig reported)   . magnesium oxide (MAG-OX) 400 MG tablet Take 400 mg by mouth daily as needed.  (Patient not taking: No sig reported) 01/23/2019: PRN  . metFORMIN  (GLUCOPHAGE-XR) 750 MG 24 hr tablet Take 750 mg by mouth daily as needed.  (Patient not taking: No sig reported) 08/14/2018: Patient still has if needed but has not had to take in several months  . polyethylene glycol (MIRALAX / GLYCOLAX) packet Take 17 g by mouth daily. Take while on narcotic pain medication to prevent constipation. (Patient not taking: No sig reported)   . trimethoprim-polymyxin b (POLYTRIM) ophthalmic solution Place 1 drop into the left eye 3 (three) times daily. (Patient not taking: No sig reported)    No facility-administered encounter medications on file as of 11/04/2020.    Allergies: Allergies  Allergen Reactions  . Augmentin [Amoxicillin-Pot Clavulanate] Hives  . Doxycycline Hives    Surgical History: No past surgical history on file.  Family History:  Family History  Problem Relation Age of Onset  .  Irritable bowel syndrome Mother   . Hyperlipidemia Father   . Hypertension Father   . Asthma Brother   . ADD / ADHD Brother   . Heart disease Maternal Grandmother   . Hyperlipidemia Maternal Grandmother   . Hypertension Maternal Grandmother   . Parkinson's disease Maternal Grandmother   . Heart attack Maternal Grandmother   . COPD Maternal Grandfather   . Hyperlipidemia Maternal Grandfather   . Hypertension Maternal Grandfather   . Anemia Maternal Grandfather   . Hyperlipidemia Paternal Grandmother   . Hypertension Paternal Grandmother   . Migraines Paternal Grandmother   . Hyperlipidemia Paternal Grandfather   . Hypertension Paternal Grandfather   . Atrial fibrillation Paternal Grandfather       Social History: Lives with: mother and father Amada Kingfisher at [place]. Studying business as major.   Physical Exam:  Vitals:   11/04/20 1116  BP: 114/72  Pulse: 80  Weight: 164 lb 3.2 oz (74.5 kg)   BP 114/72 (BP Location: Right Arm, Patient Position: Sitting, Cuff Size: Normal)   Pulse 80   Wt 164 lb 3.2 oz (74.5 kg)   BMI 23.70 kg/m  Body mass index:  body mass index is 23.7 kg/m. Growth percentile SmartLinks can only be used for patients less than 38 years old.  Ht Readings from Last 3 Encounters:  02/21/20 5' 9.8" (1.773 m) (53 %, Z= 0.07)*  09/03/19 5' 9.76" (1.772 m) (53 %, Z= 0.07)*  05/01/19 5' 9.92" (1.776 m) (55 %, Z= 0.14)*   * Growth percentiles are based on CDC (Boys, 2-20 Years) data.   Wt Readings from Last 3 Encounters:  11/04/20 164 lb 3.2 oz (74.5 kg)  08/06/20 158 lb 9.6 oz (71.9 kg)  04/23/20 159 lb 6.4 oz (72.3 kg)   General: Well developed, well nourished male in no acute distress.   Head: Normocephalic, atraumatic.   Eyes:  Pupils equal and round. EOMI.  Sclera white.  No eye drainage.   Ears/Nose/Mouth/Throat: Nares patent, no nasal drainage.  Normal dentition, mucous membranes moist.  Neck: supple, no cervical lymphadenopathy, no thyromegaly Cardiovascular: regular rate, normal S1/S2, no murmurs Respiratory: No increased work of breathing.  Lungs clear to auscultation bilaterally.  No wheezes. Abdomen: soft, nontender, nondistended. Normal bowel sounds.  No appreciable masses  Extremities: warm, well perfused, cap refill < 2 sec.   Musculoskeletal: Normal muscle mass.  Normal strength Skin: warm, dry.  No rash or lesions. Neurologic: alert and oriented, normal speech, no tremor   Labs:  Lab Results  Component Value Date   HGBA1C 8.5 (A) 11/04/2020    Assessment/Plan: Elvert is a 20 y.o. male with uncontrolled type 1 diabetes on Tslim insulin pump and Dexcom CGm. He is not bolusing consistently which leads to frequent hyperglycemia. He has struggled to make improvements to bolusing. Hemoglobin A1c is 8.5% today which is higher then ADA goal of <7.5%.    1. Uncontrolled type 1 diabetes mellitus with hyperglycemia (HCC)/ 2. Hyperglycemia/ 3. Elevated hemoglobin A1c  - Reviewed insulin pump and CGM download. Discussed trends and patterns.  - Rotate pump sites to prevent scar tissue.  - bolus 15  minutes prior to eating to limit blood sugar spikes.  - Reviewed carb counting and importance of accurate carb counting.  - Discussed signs and symptoms of hypoglycemia. Always have glucose available.  - POCT glucose and hemoglobin A1c  - Reviewed growth chart.  - Discussed exercise with type 1 diabetes.   4. Hypoglycemia unawareness  - Wear CGM  and monitor glucose levels closely  - Discussed signs and symptoms.  - Keep glucose available at all times.   6. Insulin pump titration.  NO changes. Needs to bolus    Follow-up:   3 months.    >45 spent today reviewing the medical chart, counseling the patient/family, and documenting today's visit.   When a patient is on insulin, intensive monitoring of blood glucose levels is necessary to avoid hyperglycemia and hypoglycemia. Severe hyperglycemia/hypoglycemia can lead to hospital admissions and be life threatening.     Gretchen ShortSpenser Shanyah Gattuso,  FNP-C  Pediatric Specialist  [address]  [place], [id number]  Tele: [phone]

## 2020-11-05 ENCOUNTER — Telehealth (INDEPENDENT_AMBULATORY_CARE_PROVIDER_SITE_OTHER): Payer: Self-pay | Admitting: Family

## 2020-11-05 DIAGNOSIS — E1065 Type 1 diabetes mellitus with hyperglycemia: Secondary | ICD-10-CM

## 2020-11-05 MED ORDER — DEXCOM G6 TRANSMITTER MISC: 4 refills | Status: DC

## 2020-11-05 MED ORDER — DEXCOM G6 SENSOR MISC: 1 refills | Status: DC

## 2020-11-05 NOTE — Telephone Encounter (Signed)
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 Please send rx to that location   Call ID:      PRESCRIPTION REFILL ONLY  Name of prescription: dexcom transmitter and sensor  Pharmacy: [facility]

## 2021-01-26 ENCOUNTER — Encounter (INDEPENDENT_AMBULATORY_CARE_PROVIDER_SITE_OTHER): Payer: Self-pay

## 2021-01-26 ENCOUNTER — Other Ambulatory Visit (INDEPENDENT_AMBULATORY_CARE_PROVIDER_SITE_OTHER): Payer: Self-pay | Admitting: Family

## 2021-01-26 DIAGNOSIS — E1065 Type 1 diabetes mellitus with hyperglycemia: Secondary | ICD-10-CM

## 2021-01-27 ENCOUNTER — Other Ambulatory Visit (INDEPENDENT_AMBULATORY_CARE_PROVIDER_SITE_OTHER): Payer: Self-pay

## 2021-01-27 MED ORDER — NOVOLOG FLEXPEN 100 UNIT/ML ~~LOC~~ SOPN: PEN_INJECTOR | SUBCUTANEOUS | 11 refills | Status: AC

## 2021-02-01 LAB — HM DIABETES EYE EXAM

## 2021-02-10 ENCOUNTER — Encounter (INDEPENDENT_AMBULATORY_CARE_PROVIDER_SITE_OTHER): Payer: Self-pay | Admitting: Family

## 2021-02-10 ENCOUNTER — Other Ambulatory Visit: Payer: Self-pay

## 2021-02-10 ENCOUNTER — Ambulatory Visit (INDEPENDENT_AMBULATORY_CARE_PROVIDER_SITE_OTHER): Payer: BC Managed Care – PPO | Admitting: Family

## 2021-02-10 ENCOUNTER — Encounter (INDEPENDENT_AMBULATORY_CARE_PROVIDER_SITE_OTHER): Payer: Self-pay

## 2021-02-10 VITALS — BP 116/78 | HR 92 | Wt 165.8 lb

## 2021-02-10 DIAGNOSIS — E10649 Type 1 diabetes mellitus with hypoglycemia without coma: Secondary | ICD-10-CM | POA: Diagnosis not present

## 2021-02-10 DIAGNOSIS — E1065 Type 1 diabetes mellitus with hyperglycemia: Secondary | ICD-10-CM | POA: Diagnosis not present

## 2021-02-10 DIAGNOSIS — Z9641 Presence of insulin pump (external) (internal): Secondary | ICD-10-CM

## 2021-02-10 LAB — POCT GLUCOSE (DEVICE FOR HOME USE): POC Glucose: 67 mg/dl — AB (ref 70–99)

## 2021-02-10 LAB — POCT GLYCOSYLATED HEMOGLOBIN (HGB A1C): Hemoglobin A1C: 8.6 % — AB (ref 4.0–5.6)

## 2021-02-10 NOTE — Patient Instructions (Signed)
-   YOU NEED TO BOLUS!!!!  - Hemoglobin A1c is 8.6% today which is higher then ADA goal of <7 - You will have one more follow up visit with me. After next visit, you will be referred to adult endocrinology. You will need establish with them within 6 months for medication refils.   It was a pleasure seeing you in clinic today. Please do not hesitate to contact me if you have questions or concerns.

## 2021-02-10 NOTE — Progress Notes (Signed)
Pediatric Endocrinology Diabetes follow up Consultation Visit  James Yu [birth year] [id number]  Chief Complaint:  Type 1 Diabetes    James Yu, Elige Radon, MD   HPI: [name]  is a 20 y.o. male presenting for follow-up of Type 1 Diabetes   he is accompanied to this visit by his mother.  1. [name] was diagnosed with type 1 diabetes at 20 years of age. He had been followed by [facility] since that time. He was initially on MDI and the transitioned to [facility] insulin pump. During his time with diabetes he has switch back to MDI but struggled with consistency. He is currently using an OMnipod insulin pump and Freestyle libre. He does not feel like his diabetes care has been very good over the past 2-3 year.   2. Since his last visit to clinic on 07/2020, he has been well. No ER of hospital visits.   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   Using TSlim insulin pump with Dexcom CGM. He reports fluctuating blood sugars which he feels is due to being stressed and busy after recent  loss of his Grandmother. He reports one episode of prolonged hypoglycemia which occurred while he was working on a hot day at the golf course. Overall, hypoglycemia is rare for him. He DOES NOT bolus often, relying heavily on pump to automatically bolus.   He spent one week using Omnipod while he was at the beach.   Insulin regimen: Omnipod Insulin Pump  Basal Rates 12AM 1.20   7am 1.47  10am 1.67        36.9 u/day   Insulin to Carbohydrate Ratio 12AM 12  7am  5  4pm 5          Insulin Sensitivity Factor 12AM  45         Target Blood Glucose 12AM 140  7am 120            Hypoglycemia: cannot feel most low blood sugars.  No glucagon needed recently.  Insulin pump CGm download   Med-alert ID: is not currently wearing. Injection/Pump sites: arms, abdomen.  Annual labs due: Ordered   Ophthalmology due: - Seen on 01/2021.     3. ROS: Greater than 10 systems reviewed with pertinent positives listed in HPI, otherwise neg. Constitutional: Sleeping well. Weight stable.  Eyes: No changes in vision Ears/Nose/Mouth/Throat: No difficulty swallowing. Cardiovascular: No palpitations Respiratory: No increased work of breathing Gastrointestinal: No constipation or diarrhea. No abdominal pain Genitourinary: No nocturia, no polyuria Musculoskeletal: No joint pain Neurologic: Normal sensation, no tremor Endocrine: No polydipsia.  No hyperpigmentation Psychiatric: Normal affect  Past Medical History:   Past Medical History:  Diagnosis Date   Allergy    Diabetes mellitus without complication (HCC) 08/2003   Irritable bowel syndrome (IBS)     Medications:  Outpatient Encounter Medications as of 02/10/2021  Medication Sig Note   Continuous Blood Gluc Sensor (DEXCOM G6 SENSOR) MISC CHANGE EVERY 10 DAYS    Continuous Blood Gluc Transmit (DEXCOM G6 TRANSMITTER) MISC Change transmitter every 90 days    Glucagon (BAQSIMI ONE PACK) 3 MG/DOSE POWD Place 1 Dose into the nose as needed.    glucose blood (ONETOUCH VERIO) test strip Check blood sugar 6x daily    insulin aspart (NOVOLOG FLEXPEN) 100 UNIT/ML FlexPen Use up to 50 units daily incase pump fails    insulin aspart (NOVOLOG) 100 UNIT/ML injection  INJECT 300 UNITS INTO THE PUMP EVERY 48 HOURS    Insulin Infusion Pump (T:SLIM X2 INS PUMP/CONTROL-IQ) DEVI     acetaminophen (TYLENOL) 325 MG tablet Take 2 tablets (650 mg total) by mouth every 6 (six) hours as needed for mild pain. (Patient not taking: No sig reported)    B Complex Vitamins (VITAMIN-B COMPLEX) TABS Take 1 capsule by mouth daily. (Patient not taking: No sig reported)    BD PEN NEEDLE NANO U/F 32G X 4 MM MISC USE UP TO 6 TIMES DAILY (Patient not taking: No sig reported)    BESIVANCE 0.6 % SUSP Place 1 drop into the left eye 3 (three) times daily. (Patient not taking: No  sig reported)    cefdinir (OMNICEF) 300 MG capsule Take 1 capsule (300 mg total) by mouth 2 (two) times daily. 1 po BID (Patient not taking: No sig reported)    Continuous Blood Gluc Sensor (FREESTYLE LIBRE 14 DAY SENSOR) MISC Inject into the skin. (Patient not taking: No sig reported)    dicyclomine (BENTYL) 10 MG capsule Take 1 capsule (10 mg total) by mouth 4 (four) times daily -  before meals and at bedtime. (Patient not taking: No sig reported) 01/23/2019: PRN   diphenhydrAMINE (BENADRYL) 25 mg capsule Take 1 capsule (25 mg total) by mouth every 8 (eight) hours as needed for itching. (Patient not taking: No sig reported)    DM-Phenylephrine-Acetaminophen 10-5-325 MG CAPS Take 2 capsules by mouth daily as needed. (Patient not taking: No sig reported)    docusate sodium (COLACE) 100 MG capsule Take 1 capsule (100 mg total) by mouth daily. (Patient not taking: No sig reported)    esomeprazole (NEXIUM) 20 MG capsule Take 20 mg by mouth daily at 12 noon. (Patient not taking: No sig reported)    fluticasone (FLONASE) 50 MCG/ACT nasal spray Place 1 spray into both nostrils daily as needed for allergies or rhinitis. (Patient not taking: No sig reported)    HYDROcodone-acetaminophen (NORCO) 7.5-325 MG tablet Take 1 tablet by mouth every 6 (six) hours as needed for severe pain. (Patient not taking: No sig reported)    Insulin Disposable Pump (OMNIPOD DASH 5 PACK PODS) MISC CHANGE EVERY 72 HOURS (Patient not taking: No sig reported)    loratadine (CLARITIN) 10 MG tablet Take 10 mg by mouth daily as needed.  (Patient not taking: No sig reported)    loratadine-pseudoephedrine (CLARITIN-D 24 HOUR) 10-240 MG 24 hr tablet Claritin-D 24 Hour 10 mg-240 mg tablet,extended release (Patient not taking: No sig reported)    magnesium oxide (MAG-OX) 400 MG tablet Take 400 mg by mouth daily as needed.  (Patient not taking: No sig reported) 01/23/2019: PRN   metFORMIN (GLUCOPHAGE-XR) 750 MG 24 hr tablet Take 750 mg by mouth  daily as needed.  (Patient not taking: No sig reported) 08/14/2018: Patient still has if needed but has not had to take in several months   polyethylene glycol (MIRALAX / GLYCOLAX) packet Take 17 g by mouth daily. Take while on narcotic pain medication to prevent constipation. (Patient not taking: No sig reported)    trimethoprim-polymyxin b (POLYTRIM) ophthalmic solution Place 1 drop into the left eye 3 (three) times daily. (Patient not taking: No sig reported)    [DISCONTINUED] Continuous Blood Gluc Sensor (DEXCOM G6 SENSOR) MISC CHANGE EVERY 10 DAYS (Patient not taking: Reported on 11/04/2020)    [DISCONTINUED] Continuous Blood Gluc Transmit (DEXCOM G6 TRANSMITTER) MISC 1 Units by Does not apply route as needed. (Patient not taking:  Reported on 11/04/2020)    No facility-administered encounter medications on file as of 02/10/2021.    Allergies: Allergies  Allergen Reactions   Augmentin [Amoxicillin-Pot Clavulanate] Hives   Doxycycline Hives    Surgical History: No past surgical history on file.  Family History:  Family History  Problem Relation Age of Onset   Irritable bowel syndrome Mother    Hyperlipidemia Father    Hypertension Father    Asthma Brother    ADD / ADHD Brother    Heart disease Maternal Grandmother    Hyperlipidemia Maternal Grandmother    Hypertension Maternal Grandmother    Parkinson's disease Maternal Grandmother    Heart attack Maternal Grandmother    COPD Maternal Grandfather    Hyperlipidemia Maternal Grandfather    Hypertension Maternal Grandfather    Anemia Maternal Grandfather    Hyperlipidemia Paternal Grandmother    Hypertension Paternal Grandmother    Migraines Paternal Grandmother    Hyperlipidemia Paternal Grandfather    Hypertension Paternal Grandfather    Atrial fibrillation Paternal Grandfather       Social History: Lives with: mother and father Amada Kingfisher at [place]. Studying business as major.   Physical Exam:  Vitals:   02/10/21 1051   BP: 116/78  Pulse: 92  Weight: 165 lb 12.8 oz (75.2 kg)    BP 116/78 (BP Location: Right Arm, Patient Position: Sitting, Cuff Size: Normal)   Pulse 92   Wt 165 lb 12.8 oz (75.2 kg) Comment: with shoes  BMI 23.93 kg/m  Body mass index: body mass index is 23.93 kg/m. Growth percentile SmartLinks can only be used for patients less than 50 years old.  Ht Readings from Last 3 Encounters:  02/21/20 5' 9.8" (1.773 m) (53 %, Z= 0.07)*  09/03/19 5' 9.76" (1.772 m) (53 %, Z= 0.07)*  05/01/19 5' 9.92" (1.776 m) (55 %, Z= 0.14)*   * Growth percentiles are based on CDC (Boys, 2-20 Years) data.   Wt Readings from Last 3 Encounters:  02/10/21 165 lb 12.8 oz (75.2 kg)  11/04/20 164 lb 3.2 oz (74.5 kg)  08/06/20 158 lb 9.6 oz (71.9 kg)   General: Well developed, well nourished male in no acute distress.   Head: Normocephalic, atraumatic.   Eyes:  Pupils equal and round. EOMI.  Sclera white.  No eye drainage.   Ears/Nose/Mouth/Throat: Nares patent, no nasal drainage.  Normal dentition, mucous membranes moist.  Neck: supple, no cervical lymphadenopathy, no thyromegaly Cardiovascular: regular rate, normal S1/S2, no murmurs Respiratory: No increased work of breathing.  Lungs clear to auscultation bilaterally.  No wheezes. Abdomen: soft, nontender, nondistended. Normal bowel sounds.  No appreciable masses  Extremities: warm, well perfused, cap refill < 2 sec.   Musculoskeletal: Normal muscle mass.  Normal strength Skin: warm, dry.  No rash or lesions. Neurologic: alert and oriented, normal speech, no tremor   Labs:  Lab Results  Component Value Date   HGBA1C 8.6 (A) 02/10/2021    Assessment/Plan: Doil is a 20 y.o. male with uncontrolled type 1 diabetes on Tslim insulin pump and Dexcom CGm. He frequently skips boluses and on average is bolus 1 time or less per day which. Hemgolobin A1c is 8.6% which is higher then ADA goal of <7%    1. Uncontrolled type 1 diabetes mellitus with  hyperglycemia (HCC)/  - Reviewed insulin pump and CGM download. Discussed trends and patterns.  - Rotate pump sites to prevent scar tissue.  - bolus 15 minutes prior to eating to limit blood sugar spikes.  -  Reviewed carb counting and importance of accurate carb counting.  - Discussed signs and symptoms of hypoglycemia. Always have glucose available.  - POCT glucose and hemoglobin A1c  - Reviewed growth chart.  - Stressed importance of consistently bolusing.  - He will be 20 at his next appointment and will need to start transition to adult care after next visit. Discussed in detail the process.  - Lipid panel, TFT and microalbumin ordered 2. Hypoglycemia unawareness  - Wear CGM and monitor glucose levels closely  - Discussed signs and symptoms.  - Keep glucose available at all times.   3. Insulin pump titration.  NO changes. Needs to bolus. Insulin pump is in place.    Follow-up:   3 months.    >45 spent today reviewing the medical chart, counseling the patient/family, and documenting today's visit.  When a patient is on insulin, intensive monitoring of blood glucose levels is necessary to avoid hyperglycemia and hypoglycemia. Severe hyperglycemia/hypoglycemia can lead to hospital admissions and be life threatening.     Gretchen Short,  FNP-C  Pediatric Specialist  [address]  [place], [address]  Tele: [phone]

## 2021-02-11 LAB — LIPID PANEL
Cholesterol: 129 mg/dL (ref <200)
HDL: 40 mg/dL (ref >=40)
LDL Cholesterol (Calc): 71 mg/dL (calc)
Non-HDL Cholesterol (Calc): 89 mg/dL (calc) (ref <130)
Total CHOL/HDL Ratio: 3.2 (calc) (ref <5.0)
Triglycerides: 98 mg/dL (ref <150)

## 2021-02-11 LAB — MICROALBUMIN / CREATININE URINE RATIO
Creatinine, Urine: 94 mg/dL (ref 20–320)
Microalb Creat Ratio: 5 mcg/mg creat (ref <30)
Microalb, Ur: 0.5 mg/dL

## 2021-02-11 LAB — TSH: TSH: 1.15 mIU/L (ref 0.40–4.50)

## 2021-02-11 LAB — T4, FREE: Free T4: 1.1 ng/dL (ref 0.8–1.4)

## 2021-02-14 ENCOUNTER — Other Ambulatory Visit (INDEPENDENT_AMBULATORY_CARE_PROVIDER_SITE_OTHER): Payer: Self-pay | Admitting: Family

## 2021-05-13 ENCOUNTER — Encounter (INDEPENDENT_AMBULATORY_CARE_PROVIDER_SITE_OTHER): Payer: Self-pay | Admitting: Family

## 2021-05-13 ENCOUNTER — Other Ambulatory Visit: Payer: Self-pay

## 2021-05-13 ENCOUNTER — Ambulatory Visit (INDEPENDENT_AMBULATORY_CARE_PROVIDER_SITE_OTHER): Payer: BC Managed Care – PPO | Admitting: Family

## 2021-05-13 VITALS — BP 112/70 | HR 80 | Wt 163.2 lb

## 2021-05-13 DIAGNOSIS — E1065 Type 1 diabetes mellitus with hyperglycemia: Secondary | ICD-10-CM

## 2021-05-13 DIAGNOSIS — Z23 Encounter for immunization: Secondary | ICD-10-CM | POA: Diagnosis not present

## 2021-05-13 DIAGNOSIS — Z9641 Presence of insulin pump (external) (internal): Secondary | ICD-10-CM | POA: Diagnosis not present

## 2021-05-13 LAB — POCT GLUCOSE (DEVICE FOR HOME USE): Glucose Fasting, POC: 117 mg/dL — AB (ref 70–99)

## 2021-05-13 LAB — POCT GLYCOSYLATED HEMOGLOBIN (HGB A1C): Hemoglobin A1C: 8.5 % — AB (ref 4.0–5.6)

## 2021-05-13 NOTE — Progress Notes (Signed)
Pediatric Endocrinology Diabetes follow up Consultation Visit  James Yu 06/06/2001 [id number]  Chief Complaint:  Type 1 Diabetes    Dettinger, Elige Radon, MD   HPI: James Yu  is a 20 y.o. male presenting for follow-up of Type 1 Diabetes   he is accompanied to this visit by his mother.  1. James Yu was diagnosed with type 1 diabetes at 20 years of age. He had been followed by [facility] since that time. He was initially on MDI and the transitioned to [facility] insulin pump. During his time with diabetes he has switch back to MDI but struggled with consistency. He is currently using an OMnipod insulin pump and Freestyle libre. He does not feel like his diabetes care has been very good over the past 2-3 year.   2. Since his last visit to clinic on 01/2021, he has been well. No ER of hospital visits.   Reports being stressed due to school and work. 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 Will be graduating in the spring. He plans to move to [place] after graduating.   He stopped using his Tandem tslim for a period of time. He states that he switches to his Omnipod when he is traveling. He does not want to switch to Omnipod 5 at this time. States that he is trying to bolus more but frequently forgets when he is snacking or busy. When he is on the Omnipod he runs higher because he does not have auto mode. Hypoglycemia is rare.     Insulin regimen: Omnipod Insulin Pump  Basal Rates 12AM 1.20   7am 1.47  10am 1.67        36.9 u/day   Insulin to Carbohydrate Ratio 12AM 12  7am  5  4pm 5          Insulin Sensitivity Factor 12AM  45         Target Blood Glucose 12AM 140  7am 120            Hypoglycemia: cannot feel most low blood sugars.  No glucagon needed recently.  Insulin pump CGm download   Med-alert ID: is not currently wearing. Injection/Pump sites: arms, abdomen.  Annual labs due: 01/2022 Ophthalmology due: - Seen on 01/2021.      3. ROS: Greater than 10 systems reviewed with pertinent positives listed in HPI, otherwise neg. Constitutional: Sleeping well. Weight stable.  Eyes: No changes in vision Ears/Nose/Mouth/Throat: No difficulty swallowing. Cardiovascular: No palpitations Respiratory: No increased work of breathing Gastrointestinal: No constipation or diarrhea. No abdominal pain Genitourinary: No nocturia, no polyuria Musculoskeletal: No joint pain Neurologic: Normal sensation, no tremor Endocrine: No polydipsia.  No hyperpigmentation Psychiatric: Normal affect  Past Medical History:   Past Medical History:  Diagnosis Date   Allergy    Diabetes mellitus without complication (HCC) 08/2003   Irritable bowel syndrome (IBS)     Medications:  Outpatient Encounter Medications as of 05/13/2021  Medication Sig Note   Continuous Blood Gluc Sensor (DEXCOM G6 SENSOR) MISC CHANGE EVERY 10 DAYS    Continuous Blood Gluc Transmit (DEXCOM G6 TRANSMITTER) MISC Change transmitter every 90 days    insulin aspart (NOVOLOG) 100 UNIT/ML injection INJECT 300 UNITS INTO THE PUMP EVERY 48 HOURS    Insulin Infusion Pump (T:SLIM X2 INS PUMP/CONTROL-IQ) DEVI     loratadine (CLARITIN) 10 MG tablet Take 10 mg by mouth daily as needed.    NOVOLOG 100 UNIT/ML injection INJECT 300 UNITS INTO THE PUMP EVERY 48  HOURS    acetaminophen (TYLENOL) 325 MG tablet Take 2 tablets (650 mg total) by mouth every 6 (six) hours as needed for mild pain. (Patient not taking: No sig reported)    B Complex Vitamins (VITAMIN-B COMPLEX) TABS Take 1 capsule by mouth daily. (Patient not taking: No sig reported)    BD PEN NEEDLE NANO U/F 32G X 4 MM MISC USE UP TO 6 TIMES DAILY (Patient not taking: No sig reported)    BESIVANCE 0.6 % SUSP Place 1 drop into the left eye 3 (three) times daily. (Patient not taking: No sig reported)    diphenhydrAMINE (BENADRYL) 25 mg capsule Take 1 capsule (25 mg total) by mouth every 8 (eight) hours as needed for itching.  (Patient not taking: No sig reported)    DM-Phenylephrine-Acetaminophen 10-5-325 MG CAPS Take 2 capsules by mouth daily as needed. (Patient not taking: No sig reported)    docusate sodium (COLACE) 100 MG capsule Take 1 capsule (100 mg total) by mouth daily. (Patient not taking: No sig reported)    esomeprazole (NEXIUM) 20 MG capsule Take 20 mg by mouth daily at 12 noon. (Patient not taking: No sig reported)    fluticasone (FLONASE) 50 MCG/ACT nasal spray Place 1 spray into both nostrils daily as needed for allergies or rhinitis. (Patient not taking: No sig reported)    Glucagon (BAQSIMI ONE PACK) 3 MG/DOSE POWD Place 1 Dose into the nose as needed. (Patient not taking: Reported on 05/13/2021)    glucose blood (ONETOUCH VERIO) test strip Check blood sugar 6x daily (Patient not taking: Reported on 05/13/2021)    insulin aspart (NOVOLOG FLEXPEN) 100 UNIT/ML FlexPen Use up to 50 units daily incase pump fails (Patient not taking: Reported on 05/13/2021)    Insulin Disposable Pump (OMNIPOD DASH 5 PACK PODS) MISC CHANGE EVERY 72 HOURS (Patient not taking: No sig reported)    loratadine-pseudoephedrine (CLARITIN-D 24 HOUR) 10-240 MG 24 hr tablet Claritin-D 24 Hour 10 mg-240 mg tablet,extended release (Patient not taking: No sig reported)    magnesium oxide (MAG-OX) 400 MG tablet Take 400 mg by mouth daily as needed.  (Patient not taking: No sig reported) 01/23/2019: PRN   polyethylene glycol (MIRALAX / GLYCOLAX) packet Take 17 g by mouth daily. Take while on narcotic pain medication to prevent constipation. (Patient not taking: No sig reported)    trimethoprim-polymyxin b (POLYTRIM) ophthalmic solution Place 1 drop into the left eye 3 (three) times daily. (Patient not taking: No sig reported)    [DISCONTINUED] cefdinir (OMNICEF) 300 MG capsule Take 1 capsule (300 mg total) by mouth 2 (two) times daily. 1 po BID (Patient not taking: No sig reported)    [DISCONTINUED] Continuous Blood Gluc Sensor (FREESTYLE LIBRE  14 DAY SENSOR) MISC Inject into the skin. (Patient not taking: No sig reported)    [DISCONTINUED] dicyclomine (BENTYL) 10 MG capsule Take 1 capsule (10 mg total) by mouth 4 (four) times daily -  before meals and at bedtime. (Patient not taking: No sig reported) 01/23/2019: PRN   [DISCONTINUED] HYDROcodone-acetaminophen (NORCO) 7.5-325 MG tablet Take 1 tablet by mouth every 6 (six) hours as needed for severe pain. (Patient not taking: No sig reported)    [DISCONTINUED] metFORMIN (GLUCOPHAGE-XR) 750 MG 24 hr tablet Take 750 mg by mouth daily as needed.  (Patient not taking: No sig reported) 08/14/2018: Patient still has if needed but has not had to take in several months   No facility-administered encounter medications on file as of 05/13/2021.  Allergies: Allergies  Allergen Reactions   Augmentin [Amoxicillin-Pot Clavulanate] Hives   Doxycycline Hives    Surgical History: History reviewed. No pertinent surgical history.  Family History:  Family History  Problem Relation Age of Onset   Irritable bowel syndrome Mother    Hyperlipidemia Father    Hypertension Father    Asthma Brother    ADD / ADHD Brother    Heart disease Maternal Grandmother    Hyperlipidemia Maternal Grandmother    Hypertension Maternal Grandmother    Parkinson's disease Maternal Grandmother    Heart attack Maternal Grandmother    COPD Maternal Grandfather    Hyperlipidemia Maternal Grandfather    Hypertension Maternal Grandfather    Anemia Maternal Grandfather    Hyperlipidemia Paternal Grandmother    Hypertension Paternal Grandmother    Migraines Paternal Grandmother    Hyperlipidemia Paternal Grandfather    Hypertension Paternal Grandfather    Atrial fibrillation Paternal Grandfather       Social History: Lives with: mother and father Amada Kingfisher at [place]. Studying business as major.   Physical Exam:  Vitals:   05/13/21 1117  BP: 112/70  Pulse: 80  Weight: 163 lb 3.2 oz (74 kg)     BP 112/70    Pulse 80   Wt 163 lb 3.2 oz (74 kg)   BMI 23.55 kg/m  Body mass index: body mass index is 23.55 kg/m. Growth percentile SmartLinks can only be used for patients less than 30 years old.  Ht Readings from Last 3 Encounters:  02/21/20 5' 9.8" (1.773 m) (53 %, Z= 0.07)*  09/03/19 5' 9.76" (1.772 m) (53 %, Z= 0.07)*  05/01/19 5' 9.92" (1.776 m) (55 %, Z= 0.14)*   * Growth percentiles are based on CDC (Boys, 2-20 Years) data.   Wt Readings from Last 3 Encounters:  05/13/21 163 lb 3.2 oz (74 kg)  02/10/21 165 lb 12.8 oz (75.2 kg)  11/04/20 164 lb 3.2 oz (74.5 kg)   General: Well developed, well nourished male in no acute distress.   Head: Normocephalic, atraumatic.   Eyes:  Pupils equal and round. EOMI.  Sclera white.  No eye drainage.   Ears/Nose/Mouth/Throat: Nares patent, no nasal drainage.  Normal dentition, mucous membranes moist.  Neck: supple, no cervical lymphadenopathy, no thyromegaly Cardiovascular: regular rate, normal S1/S2, no murmurs Respiratory: No increased work of breathing.  Lungs clear to auscultation bilaterally.  No wheezes. Abdomen: soft, nontender, nondistended. Normal bowel sounds.  No appreciable masses  Extremities: warm, well perfused, cap refill < 2 sec.   Musculoskeletal: Normal muscle mass.  Normal strength Skin: warm, dry.  No rash or lesions. Neurologic: alert and oriented, normal speech, no tremor    Labs:  Lab Results  Component Value Date   HGBA1C 8.5 (A) 05/13/2021    Assessment/Plan: Yaiden is a 20 y.o. male with uncontrolled type 1 diabetes on Tslim insulin pump and Dexcom CGm. He has been inconsistent with closed loop insulin pump use. Has improved his bolusing. His hemoglobin A1c is 8.5% today and TIR is 30%, both are less the ADA goal.   1. Uncontrolled type 1 diabetes mellitus with hyperglycemia (HCC)/  - Reviewed insulin pump and CGM download. Discussed trends and patterns.  - Rotate pump sites to prevent scar tissue.  - bolus 15  minutes prior to eating to limit blood sugar spikes.  - Reviewed carb counting and importance of accurate carb counting.  - Discussed signs and symptoms of hypoglycemia. Always have glucose available.  - POCT glucose  and hemoglobin A1c  - Reviewed growth chart.  - Discussed importance of consistency and preparedness with diabetes care.   2. Hypoglycemia unawareness  - Wear CGM and monitor glucose levels closely  - Discussed signs and symptoms.  - Keep glucose available at all times.   3. Insulin pump titration.  No changes needed.   Influenza vaccine given, counseling provided.    Follow-up:   4 months.   >45  spent today reviewing the medical chart, counseling the patient/family, and documenting today's visit.   When a patient is on insulin, intensive monitoring of blood glucose levels is necessary to avoid hyperglycemia and hypoglycemia. Severe hyperglycemia/hypoglycemia can lead to hospital admissions and be life threatening.     Gretchen Short,  FNP-C  Pediatric Specialist  [address]  [place], [address]  Tele: [phone]

## 2021-05-13 NOTE — Patient Instructions (Signed)
It was a pleasure seeing you in clinic today. Please do not hesitate to contact me if you have questions  Please sign up for MyChart. This is a communication tool that allows you to send an email directly to me. This can be used for questions, prescriptions and blood sugar reports. We will also release labs to you with instructions on MyChart. Please do not use MyChart if you need immediate or emergency assistance. Ask our wonderful front office staff if you need assistance.

## 2021-08-05 ENCOUNTER — Encounter (INDEPENDENT_AMBULATORY_CARE_PROVIDER_SITE_OTHER): Payer: Self-pay

## 2021-08-05 ENCOUNTER — Other Ambulatory Visit (INDEPENDENT_AMBULATORY_CARE_PROVIDER_SITE_OTHER): Payer: Self-pay | Admitting: Family

## 2021-08-05 DIAGNOSIS — E1065 Type 1 diabetes mellitus with hyperglycemia: Secondary | ICD-10-CM

## 2021-08-09 ENCOUNTER — Telehealth: Payer: Self-pay

## 2021-08-10 ENCOUNTER — Telehealth: Payer: Self-pay

## 2021-08-10 NOTE — Telephone Encounter (Signed)
PA done through cover my meds. Refer to previous notes.

## 2021-08-12 ENCOUNTER — Telehealth: Payer: Self-pay

## 2021-08-12 NOTE — Telephone Encounter (Signed)
Transmitter approved. Sensor doesn't need approval.

## 2021-09-13 ENCOUNTER — Ambulatory Visit (INDEPENDENT_AMBULATORY_CARE_PROVIDER_SITE_OTHER): Payer: BC Managed Care – PPO | Admitting: Family

## 2021-09-16 ENCOUNTER — Ambulatory Visit (INDEPENDENT_AMBULATORY_CARE_PROVIDER_SITE_OTHER): Payer: BC Managed Care – PPO | Admitting: Family

## 2021-09-16 ENCOUNTER — Other Ambulatory Visit: Payer: Self-pay

## 2021-09-16 ENCOUNTER — Encounter (INDEPENDENT_AMBULATORY_CARE_PROVIDER_SITE_OTHER): Payer: Self-pay | Admitting: Family

## 2021-09-16 VITALS — BP 124/80 | HR 92 | Wt 175.8 lb

## 2021-09-16 DIAGNOSIS — E1065 Type 1 diabetes mellitus with hyperglycemia: Secondary | ICD-10-CM

## 2021-09-16 DIAGNOSIS — E10649 Type 1 diabetes mellitus with hypoglycemia without coma: Secondary | ICD-10-CM

## 2021-09-16 DIAGNOSIS — Z4681 Encounter for fitting and adjustment of insulin pump: Secondary | ICD-10-CM | POA: Diagnosis not present

## 2021-09-16 LAB — POCT GLUCOSE (DEVICE FOR HOME USE): POC Glucose: 263 mg/dl — AB (ref 70–99)

## 2021-09-16 LAB — POCT GLYCOSYLATED HEMOGLOBIN (HGB A1C): Hemoglobin A1C: 8.3 % — AB (ref 4.0–5.6)

## 2021-09-16 NOTE — Patient Instructions (Addendum)
It was a pleasure seeing you in clinic today. Please do not hesitate to contact me if you have questions or concerns.   Please sign up for MyChart. This is a communication tool that allows you to send an email directly to me. This can be used for questions, prescriptions and blood sugar reports. We will also release labs to you with instructions on MyChart. Please do not use MyChart if you need immediate or emergency assistance. Ask our wonderful front office staff if you need assistance.   Insulin Sensitivity Factor 12AM  43--> 38

## 2021-09-16 NOTE — Progress Notes (Signed)
Pediatric Endocrinology Diabetes follow up Consultation Visit  NEWLAND SANTELL 29-Nov-2000 [id number]  Chief Complaint:  Type 1 Diabetes    Dettinger, Fransisca Kaufmann, MD   HPI: Declyn  is a 21 y.o. male presenting for follow-up of Type 1 Diabetes   he is accompanied to this visit by his mother.  1. [name] was diagnosed with type 1 diabetes at 21 years of age. He had been followed by [facility] since that time. He was initially on MDI and the transitioned to [facility] insulin pump. During his time with diabetes he has switch back to MDI but struggled with consistency. He is currently using an OMnipod insulin pump and Freestyle libre. He does not feel like his diabetes care has been very good over the past 2-3 year.   2. Since his last visit to clinic on 04/2021, he has been well. No ER of hospital visits.   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 He will be moving to [place] in July. His mom is looking for an endocrinologist there.   He is using Tslim insulin pump and Dexcom CGM. He did have to reset his pump due to "glitches" and contacted [employer]. He states that he is trying to get more independence from his mother with his diabetes care. He feels like he has done slightly better with bolusing. Rarely boluses before eating. Hypoglycemia rarely occurs. He is not consistently able to feel low blood sugars.   Insulin regimen: Omnipod Insulin Pump  Basal Rates 12AM 1.20   7am 1.47  10am 1.67        36.9 u/day   Insulin to Carbohydrate Ratio 12AM 12  7am  5  4pm 5          Insulin Sensitivity Factor 12AM  43         Target Blood Glucose 12AM 140  7am 120            Hypoglycemia: cannot feel most low blood sugars.  No glucagon needed recently.  Insulin pump CGm download   Med-alert ID: is not currently wearing. Injection/Pump sites: arms, abdomen.  Annual labs due: 01/2022 Ophthalmology due: -  Seen on 01/2021.     3. ROS: Greater than 10 systems reviewed with pertinent positives listed in HPI, otherwise neg. Constitutional: Sleeping well. Weight stable.  Eyes: No changes in vision Ears/Nose/Mouth/Throat: No difficulty swallowing. Cardiovascular: No palpitations Respiratory: No increased work of breathing Gastrointestinal: No constipation or diarrhea. No abdominal pain Genitourinary: No nocturia, no polyuria Musculoskeletal: No joint pain Neurologic: Normal sensation, no tremor Endocrine: No polydipsia.  No hyperpigmentation Psychiatric: Normal affect  Past Medical History:   Past Medical History:  Diagnosis Date   Allergy    Diabetes mellitus without complication ([place]) 0000000   Irritable bowel syndrome (IBS)     Medications:  Outpatient Encounter Medications as of 09/16/2021  Medication Sig Note   acetaminophen (TYLENOL) 325 MG tablet Take 2 tablets (650 mg total) by mouth every 6 (six) hours as needed for mild pain. (Patient not taking: No sig reported)    B Complex Vitamins (VITAMIN-B COMPLEX) TABS Take 1 capsule by mouth daily. (Patient not taking: No sig reported)    BD PEN NEEDLE NANO U/F 32G X 4 MM MISC USE UP TO 6 TIMES DAILY (Patient not taking: No sig reported)    BESIVANCE 0.6 % SUSP Place 1 drop into the left eye 3 (three) times daily. (Patient not  taking: No sig reported)    Continuous Blood Gluc Sensor (DEXCOM G6 SENSOR) MISC CHANGE EVERY 10 DAYS    Continuous Blood Gluc Transmit (DEXCOM G6 TRANSMITTER) MISC CHANGE EVERY 90 DAYS    diphenhydrAMINE (BENADRYL) 25 mg capsule Take 1 capsule (25 mg total) by mouth every 8 (eight) hours as needed for itching. (Patient not taking: No sig reported)    DM-Phenylephrine-Acetaminophen 10-5-325 MG CAPS Take 2 capsules by mouth daily as needed. (Patient not taking: No sig reported)    docusate sodium (COLACE) 100 MG capsule Take 1 capsule (100 mg total) by mouth daily. (Patient not taking: No sig reported)     esomeprazole (NEXIUM) 20 MG capsule Take 20 mg by mouth daily at 12 noon. (Patient not taking: No sig reported)    fluticasone (FLONASE) 50 MCG/ACT nasal spray Place 1 spray into both nostrils daily as needed for allergies or rhinitis. (Patient not taking: No sig reported)    Glucagon (BAQSIMI ONE PACK) 3 MG/DOSE POWD Place 1 Dose into the nose as needed. (Patient not taking: Reported on 05/13/2021)    glucose blood (ONETOUCH VERIO) test strip Check blood sugar 6x daily (Patient not taking: Reported on 05/13/2021)    insulin aspart (NOVOLOG FLEXPEN) 100 UNIT/ML FlexPen Use up to 50 units daily incase pump fails (Patient not taking: Reported on 05/13/2021)    insulin aspart (NOVOLOG) 100 UNIT/ML injection INJECT 300 UNITS INTO THE PUMP EVERY 48 HOURS    Insulin Disposable Pump (OMNIPOD DASH 5 PACK PODS) MISC CHANGE EVERY 72 HOURS (Patient not taking: No sig reported)    Insulin Infusion Pump (T:SLIM X2 INS PUMP/CONTROL-IQ) DEVI     loratadine (CLARITIN) 10 MG tablet Take 10 mg by mouth daily as needed.    loratadine-pseudoephedrine (CLARITIN-D 24 HOUR) 10-240 MG 24 hr tablet Claritin-D 24 Hour 10 mg-240 mg tablet,extended release (Patient not taking: No sig reported)    magnesium oxide (MAG-OX) 400 MG tablet Take 400 mg by mouth daily as needed.  (Patient not taking: No sig reported) 01/23/2019: PRN   NOVOLOG 100 UNIT/ML injection INJECT 300 UNITS INTO THE PUMP EVERY 48 HOURS    polyethylene glycol (MIRALAX / GLYCOLAX) packet Take 17 g by mouth daily. Take while on narcotic pain medication to prevent constipation. (Patient not taking: No sig reported)    trimethoprim-polymyxin b (POLYTRIM) ophthalmic solution Place 1 drop into the left eye 3 (three) times daily. (Patient not taking: No sig reported)    No facility-administered encounter medications on file as of 09/16/2021.    Allergies: Allergies  Allergen Reactions   Augmentin [Amoxicillin-Pot Clavulanate] Hives   Doxycycline Hives    Surgical  History: No past surgical history on file.  Family History:  Family History  Problem Relation Age of Onset   Irritable bowel syndrome Mother    Hyperlipidemia Father    Hypertension Father    Asthma Brother    ADD / ADHD Brother    Heart disease Maternal Grandmother    Hyperlipidemia Maternal Grandmother    Hypertension Maternal Grandmother    Parkinson's disease Maternal Grandmother    Heart attack Maternal Grandmother    COPD Maternal Grandfather    Hyperlipidemia Maternal Grandfather    Hypertension Maternal Grandfather    Anemia Maternal Grandfather    Hyperlipidemia Paternal Grandmother    Hypertension Paternal Grandmother    Migraines Paternal Grandmother    Hyperlipidemia Paternal Grandfather    Hypertension Paternal Grandfather    Atrial fibrillation Paternal Grandfather  Social History: Lives with: mother and father Levora Dredge at [place]. Studying business as major.   Physical Exam:  There were no vitals filed for this visit.    There were no vitals taken for this visit. Body mass index: body mass index is unknown because there is no height or weight on file. Growth percentile SmartLinks can only be used for patients less than 28 years old.  Ht Readings from Last 3 Encounters:  02/21/20 5' 9.8" (1.773 m) (53 %, Z= 0.07)*  09/03/19 5' 9.76" (1.772 m) (53 %, Z= 0.07)*  05/01/19 5' 9.92" (1.776 m) (55 %, Z= 0.14)*   * Growth percentiles are based on CDC (Boys, 2-20 Years) data.   Wt Readings from Last 3 Encounters:  05/13/21 163 lb 3.2 oz (74 kg)  02/10/21 165 lb 12.8 oz (75.2 kg)  11/04/20 164 lb 3.2 oz (74.5 kg)   General: Well developed, well nourished male in no acute distress.   Head: Normocephalic, atraumatic.   Eyes:  Pupils equal and round. EOMI.  Sclera white.  No eye drainage.   Ears/Nose/Mouth/Throat: Nares patent, no nasal drainage.  Normal dentition, mucous membranes moist.  Neck: supple, no cervical lymphadenopathy, no  thyromegaly Cardiovascular: regular rate, normal S1/S2, no murmurs Respiratory: No increased work of breathing.  Lungs clear to auscultation bilaterally.  No wheezes. Abdomen: soft, nontender, nondistended. Normal bowel sounds.  No appreciable masses  Extremities: warm, well perfused, cap refill < 2 sec.   Musculoskeletal: Normal muscle mass.  Normal strength Skin: warm, dry.  No rash or lesions. Neurologic: alert and oriented, normal speech, no tremor    Labs:  Lab Results  Component Value Date   HGBA1C 8.5 (A) 05/13/2021    Assessment/Plan: Aerion is a 21 y.o. male with uncontrolled type 1 diabetes on Tslim insulin pump and Dexcom CGm. Some improvement in consistency of bolusing but is likely underestimating carbs. Will increase his carb ratio today. Hemoglobin A1c has improved slightly to 8.3%.   1. Uncontrolled type 1 diabetes mellitus with hyperglycemia (HCC)/  - Reviewed insulin pump and CGM download. Discussed trends and patterns.  - Rotate pump sites to prevent scar tissue.  - bolus 15 minutes prior to eating to limit blood sugar spikes.  - Reviewed carb counting and importance of accurate carb counting.  - Discussed signs and symptoms of hypoglycemia. Always have glucose available.  - POCT glucose and hemoglobin A1c  - Reviewed growth chart.  - Discussed Dexcom G7 but it is not yet compatible with insulin pump.  - He will establish care with endocrine in [place]   2. Hypoglycemia unawareness  - Wear CGM and monitor glucose levels closely  - Discussed signs and symptoms.  - Keep glucose available at all times.   3. Insulin pump titration.  Insulin Sensitivity Factor 12AM  43--> 38           Influenza vaccine given, counseling provided.    Follow-up:   4 months.    >45 spent today reviewing the medical chart, counseling the patient/family, and documenting today's visit.  When a patient is on insulin, intensive monitoring of blood glucose levels is  necessary to avoid hyperglycemia and hypoglycemia. Severe hyperglycemia/hypoglycemia can lead to hospital admissions and be life threatening.     Hermenia Bers,  FNP-C  Pediatric Specialist  [address] [place]  [place], [address]  Tele: [phone]

## 2021-11-24 ENCOUNTER — Other Ambulatory Visit (INDEPENDENT_AMBULATORY_CARE_PROVIDER_SITE_OTHER): Payer: Self-pay | Admitting: Family

## 2021-11-24 DIAGNOSIS — E1065 Type 1 diabetes mellitus with hyperglycemia: Secondary | ICD-10-CM

## 2021-12-15 ENCOUNTER — Ambulatory Visit (INDEPENDENT_AMBULATORY_CARE_PROVIDER_SITE_OTHER): Payer: BC Managed Care – PPO | Admitting: Family

## 2021-12-15 ENCOUNTER — Encounter (INDEPENDENT_AMBULATORY_CARE_PROVIDER_SITE_OTHER): Payer: Self-pay | Admitting: Family

## 2021-12-15 VITALS — BP 120/76 | HR 74 | Wt 177.4 lb

## 2021-12-15 DIAGNOSIS — E1065 Type 1 diabetes mellitus with hyperglycemia: Secondary | ICD-10-CM

## 2021-12-15 DIAGNOSIS — Z9641 Presence of insulin pump (external) (internal): Secondary | ICD-10-CM | POA: Diagnosis not present

## 2021-12-15 DIAGNOSIS — E10649 Type 1 diabetes mellitus with hypoglycemia without coma: Secondary | ICD-10-CM | POA: Diagnosis not present

## 2021-12-15 LAB — POCT GLUCOSE (DEVICE FOR HOME USE): Glucose Fasting, POC: 164 mg/dL — AB (ref 70–99)

## 2021-12-15 NOTE — Progress Notes (Signed)
Pediatric Endocrinology Diabetes follow up Consultation Visit  James Yu 2001-01-10 [id number]  Chief Complaint:  Type 1 Diabetes    Dettinger, James Radon, MD   HPI: James Yu  is a 21 y.o. male presenting for follow-up of Type 1 Diabetes   he is accompanied to this visit by his mother.  1. James Yu was diagnosed with type 1 diabetes at 21 years of age. He had been followed by [facility] End[facility] since that time. He was initially on MDI and the transitioned to [facility] insulin pump. During his time with diabetes he has switch back to MDI but struggled with consistency. He is currently using an OMnipod insulin pump and Freestyle libre. He does not feel like his diabetes care has been very good over the past 2-3 year.   2. Since his last visit to clinic on 04/2021, he has been well. No ER of hospital visits.   He will be moving second week of July to [place], he has scheduled an appointment with endocrinology after he moves. He is working and spending free time exercising.   He wears [employer]andem Tslim insulin pump and Dexcom CGM. He reports occasionally having sites that fail, pump battery will die or takes pump off for long period which causes high blood sugars. He is rotating sites every 3 days, stomach and back mainly. Reports bolusing is inconsistent, some days he does not bolus at all. He also under boluses by entering 20 grams of carbs at meals instead of carb counting. He rarely has hypoglycemia.   Concerns:  - Questions about diabetes tech: libre 3, Dexcom CGM and Ilet insulin pump.   Insulin regimen: Omnipod Insulin Pump  Basal Rates 12AM 1.20   7am 1.47  10am 1.67        36.9 u/day   Insulin to Carbohydrate Ratio 12AM 12  7am  5  4pm 5          Insulin Sensitivity Factor 12AM  38         Target Blood Glucose 12AM 140  7am 120            Hypoglycemia: cannot feel most low blood sugars.  No glucagon needed recently.  Insulin pump CGm download    Med-alert ID: is not currently wearing. Injection/Pump sites: arms, abdomen.  Annual labs due: 01/2022 Ophthalmology due: - Seen on 01/2021.     3. ROS: Greater than 10 systems reviewed with pertinent positives listed in HPI, otherwise neg. Constitutional: Sleeping well. Weight stable.  Eyes: No changes in vision Ears/Nose/Mouth/Throat: No difficulty swallowing. Cardiovascular: No palpitations Respiratory: No increased work of breathing Gastrointestinal: No constipation or diarrhea. No abdominal pain Genitourinary: No nocturia, no polyuria Musculoskeletal: No joint pain Neurologic: Normal sensation, no tremor Endocrine: No polydipsia.  No hyperpigmentation Psychiatric: Normal affect  Past Medical History:   Past Medical History:  Diagnosis Date   Allergy    Diabetes mellitus without complication (HCC) 08/2003   Irritable bowel syndrome (IBS)     Medications:  Outpatient Encounter Medications as of 12/15/2021  Medication Sig Note   Continuous Blood Gluc Sensor (DEXCOM G6 SENSOR) MISC CHANGE EVERY 10 DAYS    Continuous Blood Gluc Transmit (DEXCOM G6 TRANSMITTER) MISC CHANGE EVERY 90 DAYS    insulin aspart (NOVOLOG) 100 UNIT/ML injection INJECT 300 UNITS INTO THE PUMP EVERY 48 HOURS    Insulin Infusion Pump (T:SLIM X2 INS PUMP/CONTROL-IQ) DEVI     acetaminophen (TYLENOL) 325 MG tablet Take 2 tablets (650 mg total) by mouth every  6 (six) hours as needed for mild pain. (Patient not taking: Reported on 04/23/2020)    B Complex Vitamins (VITAMIN-B COMPLEX) TABS Take 1 capsule by mouth daily. (Patient not taking: Reported on 04/23/2020)    BD PEN NEEDLE NANO U/F 32G X 4 MM MISC USE UP TO 6 TIMES DAILY (Patient not taking: No sig reported)    BESIVANCE 0.6 % SUSP Place 1 drop into the left eye 3 (three) times daily. (Patient not taking: Reported on 04/23/2020)    diphenhydrAMINE (BENADRYL) 25 mg capsule Take 1 capsule (25 mg total) by mouth every 8 (eight) hours as needed for itching.  (Patient not taking: Reported on 04/23/2020)    DM-Phenylephrine-Acetaminophen 10-5-325 MG CAPS Take 2 capsules by mouth daily as needed. (Patient not taking: Reported on 04/23/2020)    docusate sodium (COLACE) 100 MG capsule Take 1 capsule (100 mg total) by mouth daily. (Patient not taking: Reported on 04/23/2020)    esomeprazole (NEXIUM) 20 MG capsule Take 20 mg by mouth daily at 12 noon. (Patient not taking: Reported on 04/23/2020)    fluticasone (FLONASE) 50 MCG/ACT nasal spray Place 1 spray into both nostrils daily as needed for allergies or rhinitis. (Patient not taking: Reported on 04/23/2020)    Glucagon (BAQSIMI ONE PACK) 3 MG/DOSE POWD Place 1 Dose into the nose as needed. (Patient not taking: Reported on 05/13/2021)    glucose blood (ONETOUCH VERIO) test strip Check blood sugar 6x daily (Patient not taking: Reported on 05/13/2021)    insulin aspart (NOVOLOG FLEXPEN) 100 UNIT/ML FlexPen Use up to 50 units daily incase pump fails (Patient not taking: Reported on 05/13/2021)    Insulin Disposable Pump (OMNIPOD DASH 5 PACK PODS) MISC CHANGE EVERY 72 HOURS (Patient not taking: No sig reported)    loratadine (CLARITIN) 10 MG tablet Take 10 mg by mouth daily as needed. (Patient not taking: Reported on 12/15/2021)    loratadine-pseudoephedrine (CLARITIN-D 24 HOUR) 10-240 MG 24 hr tablet Claritin-D 24 Hour 10 mg-240 mg tablet,extended release (Patient not taking: No sig reported)    magnesium oxide (MAG-OX) 400 MG tablet Take 400 mg by mouth daily as needed. (Patient not taking: Reported on 12/15/2021) 01/23/2019: PRN   NOVOLOG 100 UNIT/ML injection INJECT 300 UNITS INTO THE PUMP EVERY 48 HOURS (Patient not taking: Reported on 09/16/2021)    polyethylene glycol (MIRALAX / GLYCOLAX) packet Take 17 g by mouth daily. Take while on narcotic pain medication to prevent constipation. (Patient not taking: Reported on 04/23/2020)    trimethoprim-polymyxin b (POLYTRIM) ophthalmic solution Place 1 drop into the left eye 3  (three) times daily. (Patient not taking: Reported on 04/23/2020)    No facility-administered encounter medications on file as of 12/15/2021.    Allergies: Allergies  Allergen Reactions   Augmentin [Amoxicillin-Pot Clavulanate] Hives   Doxycycline Hives    Surgical History: No past surgical history on file.  Family History:  Family History  Problem Relation Age of Onset   Irritable bowel syndrome Mother    Hyperlipidemia Father    Hypertension Father    Asthma Brother    ADD / ADHD Brother    Heart disease Maternal Grandmother    Hyperlipidemia Maternal Grandmother    Hypertension Maternal Grandmother    Parkinson's disease Maternal Grandmother    Heart attack Maternal Grandmother    COPD Maternal Grandfather    Hyperlipidemia Maternal Grandfather    Hypertension Maternal Grandfather    Anemia Maternal Grandfather    Hyperlipidemia Paternal Grandmother    Hypertension  Paternal Grandmother    Migraines Paternal Grandmother    Hyperlipidemia Paternal Grandfather    Hypertension Paternal Grandfather    Atrial fibrillation Paternal Grandfather       Social History: Lives with: mother and father Amada Kingfisher at [place]. Studying business as major.   Physical Exam:  Vitals:   12/15/21 1112  BP: 120/76  Pulse: 74  Weight: 177 lb 6.4 oz (80.5 kg)      BP 120/76   Pulse 74   Wt 177 lb 6.4 oz (80.5 kg)   BMI 25.60 kg/m  Body mass index: body mass index is 25.6 kg/m. Growth percentile SmartLinks can only be used for patients less than 43 years old.  Ht Readings from Last 3 Encounters:  02/21/20 5' 9.8" (1.773 m) (53 %, Z= 0.07)*  09/03/19 5' 9.76" (1.772 m) (53 %, Z= 0.07)*  05/01/19 5' 9.92" (1.776 m) (55 %, Z= 0.14)*   * Growth percentiles are based on CDC (Boys, 2-20 Years) data.   Wt Readings from Last 3 Encounters:  12/15/21 177 lb 6.4 oz (80.5 kg)  09/16/21 175 lb 12.8 oz (79.7 kg)  05/13/21 163 lb 3.2 oz (74 kg)   General: Well developed, well nourished  male in no acute distress.   Head: Normocephalic, atraumatic.   Eyes:  Pupils equal and round. EOMI.  Sclera white.  No eye drainage.   Ears/Nose/Mouth/Throat: Nares patent, no nasal drainage.  Normal dentition, mucous membranes moist.  Neck: supple, no cervical lymphadenopathy, no thyromegaly Cardiovascular: regular rate, normal S1/S2, no murmurs Respiratory: No increased work of breathing.  Lungs clear to auscultation bilaterally.  No wheezes. Abdomen: soft, nontender, nondistended. Normal bowel sounds.  No appreciable masses  Extremities: warm, well perfused, cap refill < 2 sec.   Musculoskeletal: Normal muscle mass.  Normal strength Skin: warm, dry.  No rash or lesions. Neurologic: alert and oriented, normal speech, no tremor   Labs:  Lab Results  Component Value Date   HGBA1C 8.3 (A) 09/16/2021    Assessment/Plan: Demario is a 21 y.o. male with uncontrolled type 1 diabetes on Tslim insulin pump and Dexcom CGm. His blood glucose control is worsening due to poor compliance and infrequently bolusing via insulin pump. He also tends to underestimate carbs leading to hyperglycemia. His TIR is 15% which is below goal of >70%.    1. Uncontrolled type 1 diabetes mellitus with hyperglycemia (HCC)/  - Reviewed insulin pump and CGM download. Discussed trends and patterns.  - Rotate pump sites to prevent scar tissue.  - bolus 15 minutes prior to eating to limit blood sugar spikes.  - Reviewed carb counting and importance of accurate carb counting.  - Discussed signs and symptoms of hypoglycemia. Always have glucose available.  - POCT glucose  - Reviewed growth chart.  - Discussed diabetes technology including Dexcom G7, Libre 3 and Islet insulin pumps.  - Discussed transition to adult care. He has appointment when he moves to [place].    2. Hypoglycemia unawareness  - Wear CGM and monitor glucose levels closely  - Discussed signs and symptoms.  - Keep glucose available at all  times.   3. Insulin pump titration.  No changes.    Follow-up:   4 months.   LOS: >45 spent today reviewing the medical chart, counseling the patient/family, and documenting today's visit.   When a patient is on insulin, intensive monitoring of blood glucose levels is necessary to avoid hyperglycemia and hypoglycemia. Severe hyperglycemia/hypoglycemia can lead to hospital admissions and be  life threatening.     Hermenia Bers,  FNP-C  Pediatric Specialist  [address] [place]  [place], [address]  Tele: [phone]

## 2021-12-15 NOTE — Patient Instructions (Signed)
It was a pleasure seeing you in clinic today. Please do not hesitate to contact me if you have questions or concerns.  ° °Please sign up for MyChart. This is a communication tool that allows you to send an email directly to me. This can be used for questions, prescriptions and blood sugar reports. We will also release labs to you with instructions on MyChart. Please do not use MyChart if you need immediate or emergency assistance. Ask our wonderful front office staff if you need assistance.  ° °

## 2022-01-04 ENCOUNTER — Other Ambulatory Visit (INDEPENDENT_AMBULATORY_CARE_PROVIDER_SITE_OTHER): Payer: Self-pay | Admitting: Family

## 2022-01-26 ENCOUNTER — Other Ambulatory Visit (INDEPENDENT_AMBULATORY_CARE_PROVIDER_SITE_OTHER): Payer: Self-pay | Admitting: Family

## 2022-01-26 DIAGNOSIS — E1065 Type 1 diabetes mellitus with hyperglycemia: Secondary | ICD-10-CM

## 2022-01-31 ENCOUNTER — Other Ambulatory Visit (INDEPENDENT_AMBULATORY_CARE_PROVIDER_SITE_OTHER): Payer: Self-pay | Admitting: Family

## 2022-02-03 LAB — HM DIABETES EYE EXAM

## 2023-10-31 ENCOUNTER — Encounter (INDEPENDENT_AMBULATORY_CARE_PROVIDER_SITE_OTHER): Payer: Self-pay

## 2023-11-13 ENCOUNTER — Encounter (INDEPENDENT_AMBULATORY_CARE_PROVIDER_SITE_OTHER): Payer: Self-pay
